# Patient Record
Sex: Male | Born: 1960 | Race: Black or African American | Hispanic: No | Marital: Married | Smoking: Former smoker
Health system: Southern US, Community
[De-identification: ages and names within clinical notes are randomized; demographics above are authoritative.]

## PROBLEM LIST (undated history)

## (undated) DIAGNOSIS — I219 Acute myocardial infarction, unspecified: Secondary | ICD-10-CM

## (undated) DIAGNOSIS — I10 Essential (primary) hypertension: Secondary | ICD-10-CM

## (undated) HISTORY — PX: JOINT REPLACEMENT: SHX530

## (undated) HISTORY — PX: ANGIOPLASTY: SHX39

---

## 1999-07-27 ENCOUNTER — Emergency Department (HOSPITAL_COMMUNITY): Admission: EM | Admit: 1999-07-27 | Discharge: 1999-07-27 | Payer: Self-pay | Admitting: Emergency Medicine

## 2000-03-06 ENCOUNTER — Emergency Department (HOSPITAL_COMMUNITY): Admission: EM | Admit: 2000-03-06 | Discharge: 2000-03-06 | Payer: Self-pay

## 2002-02-18 ENCOUNTER — Emergency Department (HOSPITAL_COMMUNITY): Admission: EM | Admit: 2002-02-18 | Discharge: 2002-02-19 | Payer: Self-pay | Admitting: Emergency Medicine

## 2002-02-19 ENCOUNTER — Encounter: Payer: Self-pay | Admitting: Emergency Medicine

## 2002-12-09 ENCOUNTER — Emergency Department (HOSPITAL_COMMUNITY): Admission: EM | Admit: 2002-12-09 | Discharge: 2002-12-09 | Payer: Self-pay | Admitting: Emergency Medicine

## 2005-01-28 ENCOUNTER — Emergency Department (HOSPITAL_COMMUNITY): Admission: EM | Admit: 2005-01-28 | Discharge: 2005-01-28 | Payer: Self-pay | Admitting: Emergency Medicine

## 2005-01-28 ENCOUNTER — Emergency Department (HOSPITAL_COMMUNITY): Admission: EM | Admit: 2005-01-28 | Discharge: 2005-01-29 | Payer: Self-pay | Admitting: Emergency Medicine

## 2005-10-06 ENCOUNTER — Emergency Department (HOSPITAL_COMMUNITY): Admission: EM | Admit: 2005-10-06 | Discharge: 2005-10-06 | Payer: Self-pay | Admitting: Emergency Medicine

## 2005-10-14 ENCOUNTER — Ambulatory Visit: Payer: Self-pay | Admitting: Internal Medicine

## 2005-11-16 ENCOUNTER — Encounter: Admission: RE | Admit: 2005-11-16 | Discharge: 2005-11-16 | Payer: Self-pay | Admitting: Occupational Medicine

## 2006-01-04 ENCOUNTER — Encounter: Admission: RE | Admit: 2006-01-04 | Discharge: 2006-01-04 | Payer: Self-pay | Admitting: *Deleted

## 2006-01-07 ENCOUNTER — Emergency Department (HOSPITAL_COMMUNITY): Admission: EM | Admit: 2006-01-07 | Discharge: 2006-01-07 | Payer: Self-pay | Admitting: Emergency Medicine

## 2006-02-17 ENCOUNTER — Ambulatory Visit: Payer: Self-pay | Admitting: Family Medicine

## 2006-02-18 ENCOUNTER — Encounter: Admission: RE | Admit: 2006-02-18 | Discharge: 2006-02-18 | Payer: Self-pay | Admitting: Family Medicine

## 2006-06-15 ENCOUNTER — Emergency Department (HOSPITAL_COMMUNITY): Admission: EM | Admit: 2006-06-15 | Discharge: 2006-06-15 | Payer: Self-pay | Admitting: Emergency Medicine

## 2006-06-16 ENCOUNTER — Encounter: Payer: Self-pay | Admitting: Vascular Surgery

## 2006-06-16 ENCOUNTER — Emergency Department (HOSPITAL_COMMUNITY): Admission: EM | Admit: 2006-06-16 | Discharge: 2006-06-16 | Payer: Self-pay | Admitting: Emergency Medicine

## 2006-06-20 ENCOUNTER — Ambulatory Visit: Payer: Self-pay | Admitting: Internal Medicine

## 2006-06-21 ENCOUNTER — Inpatient Hospital Stay (HOSPITAL_COMMUNITY): Admission: EM | Admit: 2006-06-21 | Discharge: 2006-06-25 | Payer: Self-pay | Admitting: Emergency Medicine

## 2006-06-22 ENCOUNTER — Ambulatory Visit: Payer: Self-pay | Admitting: Internal Medicine

## 2006-06-26 ENCOUNTER — Emergency Department (HOSPITAL_COMMUNITY): Admission: EM | Admit: 2006-06-26 | Discharge: 2006-06-26 | Payer: Self-pay | Admitting: Emergency Medicine

## 2006-06-27 ENCOUNTER — Ambulatory Visit: Payer: Self-pay | Admitting: Internal Medicine

## 2006-07-01 ENCOUNTER — Ambulatory Visit: Payer: Self-pay | Admitting: Internal Medicine

## 2006-07-05 ENCOUNTER — Ambulatory Visit: Payer: Self-pay | Admitting: Internal Medicine

## 2006-07-07 ENCOUNTER — Emergency Department (HOSPITAL_COMMUNITY): Admission: EM | Admit: 2006-07-07 | Discharge: 2006-07-07 | Payer: Self-pay | Admitting: Emergency Medicine

## 2006-07-08 ENCOUNTER — Ambulatory Visit: Payer: Self-pay | Admitting: Internal Medicine

## 2006-07-13 ENCOUNTER — Ambulatory Visit: Payer: Self-pay | Admitting: Internal Medicine

## 2006-07-19 ENCOUNTER — Ambulatory Visit: Payer: Self-pay | Admitting: Internal Medicine

## 2006-08-09 ENCOUNTER — Ambulatory Visit: Payer: Self-pay | Admitting: Internal Medicine

## 2006-09-18 ENCOUNTER — Emergency Department (HOSPITAL_COMMUNITY): Admission: EM | Admit: 2006-09-18 | Discharge: 2006-09-18 | Payer: Self-pay | Admitting: Emergency Medicine

## 2006-10-02 DIAGNOSIS — K219 Gastro-esophageal reflux disease without esophagitis: Secondary | ICD-10-CM | POA: Insufficient documentation

## 2006-10-31 ENCOUNTER — Ambulatory Visit: Payer: Self-pay | Admitting: Internal Medicine

## 2006-10-31 LAB — CONVERTED CEMR LAB
Basophils Relative: 0.5 % (ref 0.0–1.0)
Eosinophils Relative: 4.5 % (ref 0.0–5.0)
Lymphocytes Relative: 49.4 % — ABNORMAL HIGH (ref 12.0–46.0)
MCHC: 33 g/dL (ref 30.0–36.0)
MCV: 85.2 fL (ref 78.0–100.0)
Monocytes Relative: 11.8 % — ABNORMAL HIGH (ref 3.0–11.0)
Neutro Abs: 1.7 10*3/uL (ref 1.4–7.7)
RBC: 5 M/uL (ref 4.22–5.81)
RDW: 13.2 % (ref 11.5–14.6)

## 2006-11-23 ENCOUNTER — Ambulatory Visit: Payer: Self-pay | Admitting: Internal Medicine

## 2006-11-24 ENCOUNTER — Encounter: Admission: RE | Admit: 2006-11-24 | Discharge: 2006-11-24 | Payer: Self-pay | Admitting: Occupational Medicine

## 2006-12-05 ENCOUNTER — Ambulatory Visit: Payer: Self-pay | Admitting: Internal Medicine

## 2006-12-23 ENCOUNTER — Ambulatory Visit: Payer: Self-pay | Admitting: Internal Medicine

## 2007-01-06 ENCOUNTER — Ambulatory Visit: Payer: Self-pay | Admitting: Internal Medicine

## 2007-01-08 ENCOUNTER — Emergency Department (HOSPITAL_COMMUNITY): Admission: EM | Admit: 2007-01-08 | Discharge: 2007-01-08 | Payer: Self-pay | Admitting: Emergency Medicine

## 2007-01-09 ENCOUNTER — Encounter: Admission: RE | Admit: 2007-01-09 | Discharge: 2007-01-09 | Payer: Self-pay | Admitting: Occupational Medicine

## 2007-01-13 ENCOUNTER — Ambulatory Visit: Payer: Self-pay | Admitting: Internal Medicine

## 2007-04-15 ENCOUNTER — Inpatient Hospital Stay (HOSPITAL_COMMUNITY): Admission: EM | Admit: 2007-04-15 | Discharge: 2007-04-19 | Payer: Self-pay | Admitting: Emergency Medicine

## 2007-06-29 ENCOUNTER — Ambulatory Visit (HOSPITAL_COMMUNITY): Admission: RE | Admit: 2007-06-29 | Discharge: 2007-06-30 | Payer: Self-pay | Admitting: Cardiology

## 2007-11-14 ENCOUNTER — Emergency Department (HOSPITAL_COMMUNITY): Admission: EM | Admit: 2007-11-14 | Discharge: 2007-11-14 | Payer: Self-pay | Admitting: Emergency Medicine

## 2007-11-27 ENCOUNTER — Emergency Department (HOSPITAL_COMMUNITY): Admission: EM | Admit: 2007-11-27 | Discharge: 2007-11-27 | Payer: Self-pay | Admitting: Emergency Medicine

## 2008-03-06 ENCOUNTER — Ambulatory Visit (HOSPITAL_COMMUNITY): Admission: RE | Admit: 2008-03-06 | Discharge: 2008-03-06 | Payer: Self-pay | Admitting: Cardiology

## 2008-04-30 ENCOUNTER — Ambulatory Visit (HOSPITAL_COMMUNITY): Admission: RE | Admit: 2008-04-30 | Discharge: 2008-04-30 | Payer: Self-pay | Admitting: Cardiology

## 2009-03-12 IMAGING — CR DG CHEST 1V PORT
1 series · 1 of 1 positions shown · non-contrast
Comparison: 11/16/05 and 09/18/06.

CLINICAL DATA: Chest pain.
PORTABLE CHEST ? 1 VIEW:

[view not recorded]
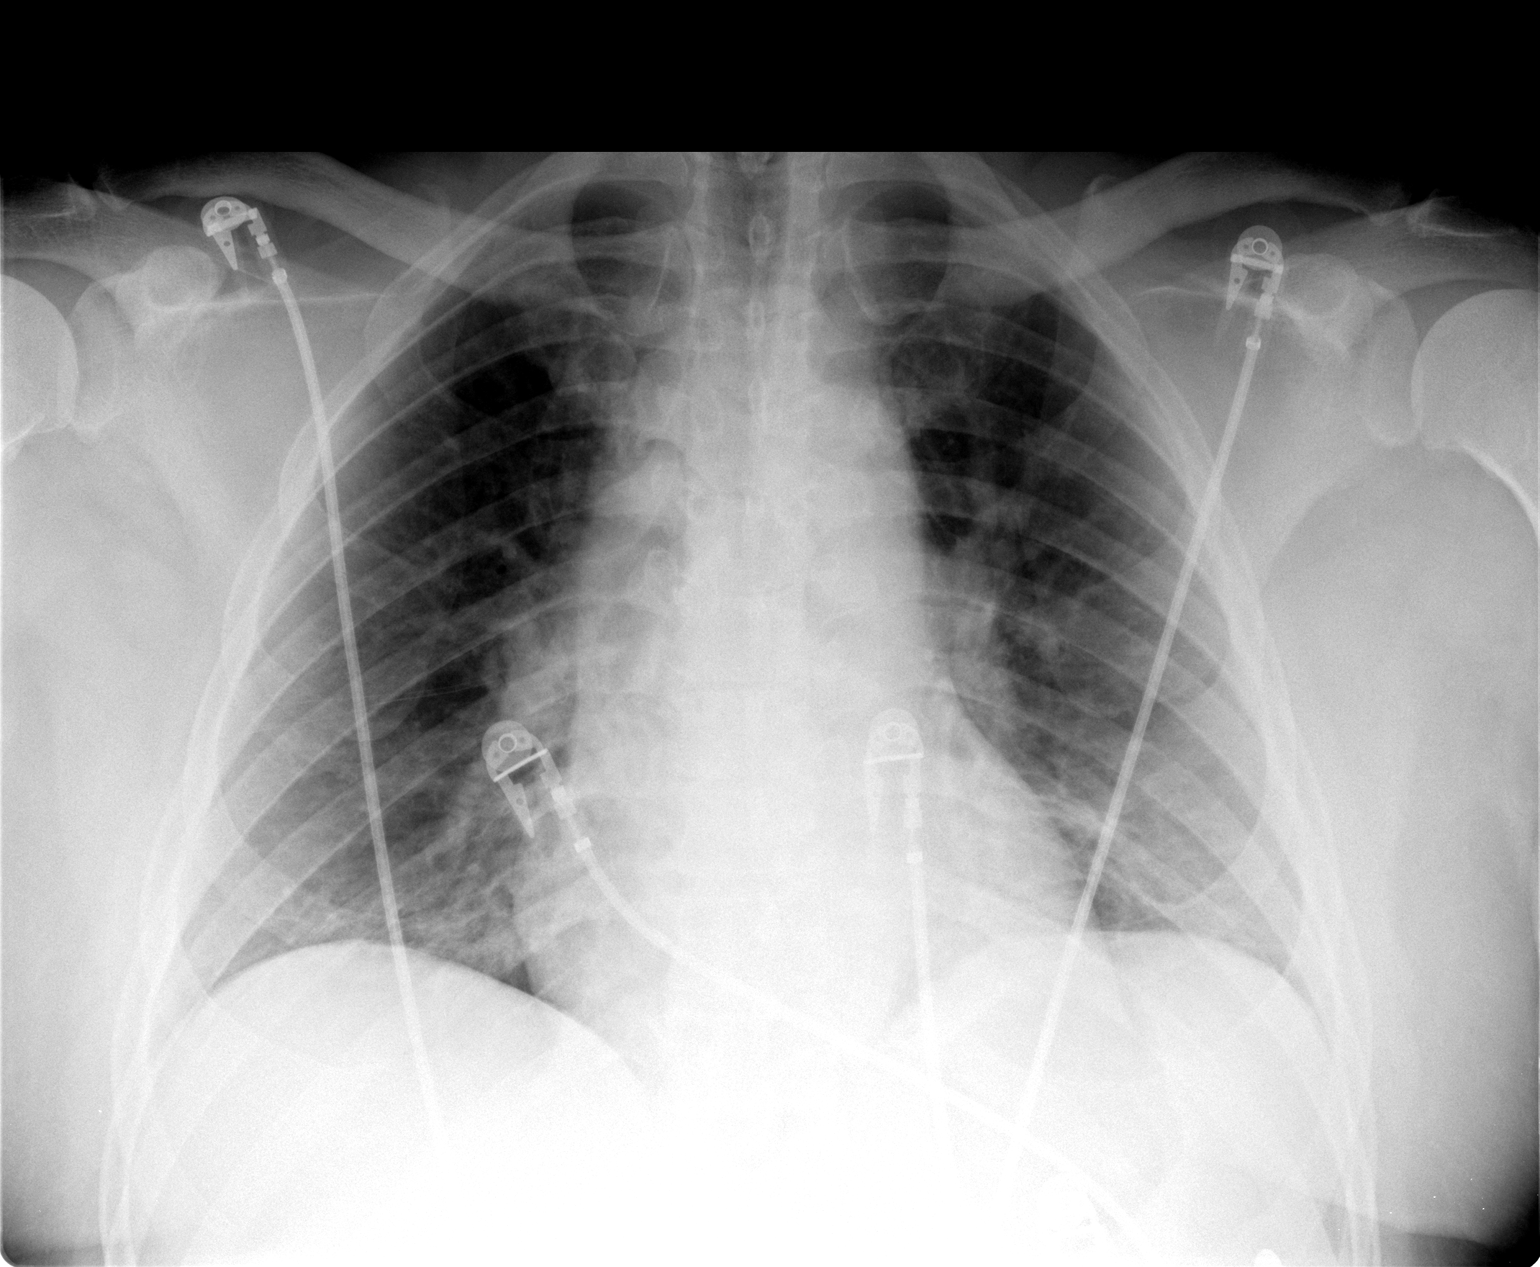

[1 of 1 positions shown; findings below may reference images not displayed]

FINDINGS: Low lung volumes with vascular congestion and bibasilar atelectasis.  Prominent heart size.  No effusion or pneumothorax.
IMPRESSION: Low volume exam with bibasilar atelectasis.

## 2010-10-11 ENCOUNTER — Encounter: Payer: Self-pay | Admitting: Occupational Medicine

## 2010-10-11 ENCOUNTER — Encounter: Payer: Self-pay | Admitting: *Deleted

## 2011-02-02 NOTE — Discharge Summary (Signed)
NAME:  QAADIR, KENT               ACCOUNT NO.:  [id number]   MEDICAL RECORD NO.:  [id number]          PATIENT TYPE:  OIB   LOCATION:  6525                         FACILITY:  [facility]   PHYSICIAN:  Mohan N. Sharyn Lull, M.D. DATE OF BIRTH:  [birth year]   DATE OF ADMISSION:  06/29/2007  DATE OF DISCHARGE:  06/30/2007                               DISCHARGE SUMMARY   ADMITTING DIAGNOSES:  1. New onset angina, rule out restenosis with progression of coronary      artery disease.  2. Coronary artery disease.  3. History of anteroseptal wall myocardial infarction in July 2008.  4. Multivessel coronary artery disease.  5. Hypertension.  6. Borderline diabetes mellitus controlled by diet.  7. Hypercholesteremia.  8. History of alcohol abuse.  9. History of tobacco abuse.  10.Morbid obesity.  11.Positive family history of coronary artery disease.   DISCHARGE DIAGNOSES:  1. Stable angina, status post percutaneous transluminal coronary      angiography stenting to mid right coronary artery.  2. Coronary artery disease.  3. History of anteroseptal wall myocardial infarction in July 2008.  4. Hypertension.  5. Hypercholesteremia.  6. Glucose intolerance.  7. Positive family history of coronary artery disease.  8. Morbid obesity.  9. History of alcohol abuse.  10.History of tobacco abuse.   DISCHARGE MEDICATIONS:  1. Enteric-coated aspirin 325 mg 1 tablet daily.  2. Plavix 75 mg 1 tablet daily with food.  3. Toprol XL 50 mg 1 tablet daily.  4. Lisinopril 5 mg 1 tablet daily.  5. Lipitor 80 mg 1 tablet daily.  6. Nitrostat 0.4 mg sublingual, use as directed.   DIET:  Low salt, low cholesterol, 1800 calorie, ADA diet.  The patient  has been advised to avoid sweets.   SPECIAL INSTRUCTIONS:  Post PTCA and stent instructions have been given.   ACTIVITY:  The patient has been advised to avoid any lifting, driving,  pushing, pulling or sexual activity x1 week.   CONDITION AT DISCHARGE:   Stable.   HISTORY OF PRESENT ILLNESS:  Mr. Horn is a 50 year old, black male  with past medical history significant for anteroseptal wall MI, status  post PCI to LAD, multivessel coronary artery disease, hypertension,  hypercholesteremia with history of DVT in the past.  He came to the  office complaining of retrosternal chest pain off and on associated mild  shortness of breath.  Denies any nausea, vomiting or diaphoresis.  He  denies PND, orthopnea, leg swelling.  Denies palpitation,  lightheadedness or syncope.  The patient had PTCA stenting to LAD in  July 2008, and was noted to have critical stenosis in mid RCA.  The  patient is referred for left catheterization and possible PTCA stenting  to RCA.   PAST MEDICAL HISTORY:  As above.   PAST SURGICAL HISTORY:  Left knee surgery in the past.   ALLERGIES:  NO KNOWN DRUG ALLERGIES.   MEDICATIONS:  Aspirin, Plavix, Toprol and lisinopril.   SOCIAL HISTORY:  He is married on Disability.  He has three children.  Smoked half pack per day x30 years, quit after MI.  He drank hard liquor  for few years, quit 2-3 years ago.   FAMILY HISTORY:  Father died of MI at the age of 59.  Mother is alive  and she is 50.  She is diabetic.  He has three sisters and two brothers  in good health.   PHYSICAL EXAMINATION:  GENERAL:  He is alert and oriented x3 in no acute  distress.  VITAL SIGNS:  Blood pressure was 124/78, pulse was 60 and regular.  HEENT:  Conjunctivae was pink.  NECK:  Supple, no JVD, no bruit.  LUNGS:  Clear to auscultation without rhonchi or rales.  CARDIOVASCULAR:  There was a soft systolic murmur.  ABDOMEN:  Soft.  Bowel sounds were present, nontender.  EXTREMITIES:  There is no clubbing, cyanosis or edema.   LABORATORY DATA AND X-RAY FINDINGS:  Postprocedure hemoglobin is 12.5,  hematocrit 38.4, white count of 6.7.  Blood sugar is high normal at 97.  CPK is 142, MB 1.7, potassium is 4.1, BUN 8, creatinine 1.02.    HOSPITAL COURSE:  The patient was an a.m. admit and underwent a left  cardiac catheterization with selective left and right coronary  angiography and PTCA stenting to mid RCA as per procedure report.  The  patient tolerated procedure well.  There were no complications.  Postprocedure, the patient did not have any chest pains.  His groin is  stable with no evidence of hematoma or bruit.  Phase I cardiac rehab was  called.  The patient has been ambulating in  hallway without any problems.  The patient will be discharged home on  above medications and will be followed up in my office in 1 week and Dr.  Algie Coffer as scheduled.  The patient will be scheduled for phase II  cardiac rehab as outpatient.      Eduardo Osier. Sharyn Lull, M.D.  Electronically Signed     MNH/MEDQ  D:  06/30/2007  T:  07/01/2007  Job:  [id number]   cc:   Ricki Rodriguez, M.D.

## 2011-02-02 NOTE — Cardiovascular Report (Signed)
NAME:  Tim Garcia, Tim Garcia               ACCOUNT NO.:  [id number]   MEDICAL RECORD NO.:  [id number]          PATIENT TYPE:  OIB   LOCATION:  6525                         FACILITY:  [facility]   PHYSICIAN:  Mohan N. Sharyn Lull, M.D. DATE OF BIRTH:  1961-07-14   DATE OF PROCEDURE:  06/29/2007  DATE OF DISCHARGE:                            CARDIAC CATHETERIZATION   PROCEDURE:  1. Left cardiac catheterization with selective left and right coronary      angiography via right groin using Judkins technique.  2. Successful PTCA to mid RCA using 2.5 x 8-mm long Voyager balloon.  3. Successful deployment of 2.75 x 16-mm long Taxus drug-eluting stent      in mid RCA.  4. Successful post dilatation of Taxus drug-eluting stent using 3.0 x      8-mm long Powersail balloon.   INDICATIONS FOR PROCEDURE:  Tim Garcia is a 50 year old black male with  past medical history significant for anteroseptal wall MI in July 2008,  status post PCI to LAD, multivessel CAD, hypertension,  hypercholesteremia, history of DVT and PE in the past.  He was referred  from Dr. Algie Coffer office because of recurrent retrosternal chest pain off  and on associated with mild shortness of breath.  Denies any nausea,  vomiting, diaphoresis.  Denies PND, orthopnea, leg swelling.  Denies  palpitation, lightheadedness or syncope.  The patient had PCI to LAD in  July 2008 and was noted to have critical mid RCA stenosis.  The patient  is referred for PCI to RCA.   PAST MEDICAL HISTORY:  As above.   PAST SURGICAL HISTORY:  He had left knee surgery in the past.   ALLERGIES:  No known drug allergies.   MEDICATION AT HOME:  He is on:  1. Aspirin 81 mg p.o. daily.  2. Plavix 75 mg p.o. daily.  3. Coumadin one tablet daily.  4. Toprol-XL 50 mg one tablet daily.  5. Lisinopril 10 mg a half a tablet daily.   SOCIAL HISTORY:  He is married, on disability, three children.  Smoked a  half a pack per day for 30 years, quit after MI in July 2008.   Drank  hard liquor for few years, quit 2-3 years ago.   FAMILY HISTORY:  Father died of MI at age of 67.  Mother is alive, she  is 67, she is diabetic.  He is two brothers, three sisters in good  health.   EXAMINATION:  He is alert, awake, oriented x3 in no acute distress.  Blood pressure was 124/78, pulse was 60 and regular.  Conjunctivae were  pink.  NECK:  Supple.  No JVD, no bruit.  LUNGS:  Clear to auscultation without rhonchi or rales.  CARDIOVASCULAR:  S1, S2 was normal.  There was a soft systolic murmur.  ABDOMEN:  Soft.  Bowel sounds were present, nontender.  EXTREMITIES:  There was no clubbing, cyanosis or edema.   IMPRESSION:  1. New-onset angina.  2. Coronary artery disease status post anteroseptal wall myocardial      infarction.  3. Multivessel coronary artery disease.  4. Hypertension.  5. Borderline  diabetes mellitus.  6. Hypercholesteremia.  7. 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  8. History of alcohol abuse and tobacco abuse.  9. Morbid obesity.  10.Family history of coronary artery disease.   Discussed with the patient and his wife at length regarding left  catheterization, possible PTCA with stenting; its risks and benefits,  i.e. death, MI, stroke, need for emergency CABG, risk of restenosis,  local vascular complications, etc. and consented for PCI.   PROCEDURE:  After obtaining the informed consent the patient was brought  to the catheterization laboratory and was placed on the fluoroscopy  table.  The right groin was prepped and draped in usual fashion.  Xylocaine 2% was used for local anesthesia in the right groin.  With the  help of thin-wall needle 6-French arterial sheath was placed.  The  sheath was aspirated and flushed.  Next, 6-French left Judkins catheter  was advanced over the wire under fluoroscopic guidance up to the  ascending aorta.  Wire was pulled out, the catheter was aspirated and  connected to the  manifold.  Catheter was further advanced and engaged  into left coronary ostium.  Multiple views of the left system were  taken.  Next, the catheter was disengaged and was pulled out over the  wire and was replaced with 6-French right Judkins catheter, which was  advanced over the wire under fluoroscopic guidance up to the ascending  aorta.  Wire was pulled out, the catheter was aspirated and connected to  the manifold.  Catheter was further advanced and engaged into right  coronary ostium.  Multiple views of the right system were taken.  Next,  __________   FINDINGS:  LV was not done as the patient had LV done approximately 2  months ago.  Left main was patent.  LAD was patent at prior PTCA and  stented site and had 20-25% mid sequential stenosis beyond the stent.  Diagonal one and two were small, which were patent.  Left circumflex has  5-10% proximal and mid sequential stenosis, which is a large vessel.  OM-  1 is moderate size which is patent.  OM-2 and OM-3 are small, which are  patent.  RCA has 20-30% proximal stenosis and then 75% mid stenosis.  PDA and PLV branches are small, which are patent.   INTERVENTIONAL PROCEDURE:  Successful PTCA to mid RCA was done using 2.5  x 8-mm long Voyager balloon for predilatation, and then 2.75 x 16-mm  long Taxus drug-eluting stent was deployed at 18 atmospheres pressure in  the mid RCA.  Stent was postdilated using 3.0 x 8-mm long Powersail  balloon going up to 20 atmospheres pressure.  Lesion was dilated from  75% to 0% residual with excellent TIMI grade 3 distal flow without  evidence of dissection or distal embolization.  The patient received  weight-based Angiomax and 300 mg of Plavix during the procedure.  The  patient tolerated the procedure well and there were no complications.  The patient was transferred to recovery room in stable condition.      Tim Garcia. Sharyn Lull, M.D.  Electronically Signed     MNH/MEDQ  D:  06/29/2007  T:   06/29/2007  Job:  [id number]   cc:   Cath Lab  Ricki Rodriguez, M.D.

## 2011-02-02 NOTE — Op Note (Signed)
NAME:  Tim Garcia, Tim Garcia NO.:  [id number]   MEDICAL RECORD NO.:  [id number]          PATIENT TYPE:  OIB   LOCATION:  2899                         FACILITY:  [facility]   PHYSICIAN:  Eduardo Osier. Sharyn Lull, M.D. DATE OF BIRTH:  Apr 05, 1961   DATE OF PROCEDURE:  04/30/2008  DATE OF DISCHARGE:  04/30/2008                               OPERATIVE REPORT   PROCEDURE:  Left cardiac catheterization with selective left and right  coronary angiography, left ventriculography via right groin using  Judkins technique.   INDICATION FOR THE PROCEDURE:  Mr. Gittleman is a 50 year old black male  with past medical history significant for coronary artery disease,  status post anteroseptal wall MI, multivessel coronary artery disease,  status post PTCA stenting to LAD and RCA in the past, hypertension,  hypercholesterolemia, history of DVT in the past, glucose intolerance,  morbid obesity, history of alcohol abuse and tobacco abuse, complaints  of retrosternal chest pain, off and on lasting few minutes, which  relieves with sublingual nitroglycerin.  Denies any nausea, vomiting, or  diaphoresis.  Denies palpitation, lightheadedness, or syncope.  Denies  PND, orthopnea, or leg swelling.  The patient underwent Persantine  Myoview on March 06, 2008, which showed apical and distal septal scar  with small focus of peri-infarct ischemia with EF of 42%.   PAST MEDICAL HISTORY:  As above.   PAST SURGICAL HISTORY:  He had left knee surgery in the past.   ALLERGIES:  He has intolerance to ACE inhibitors, he gets cough.   MEDICATIONS AT HOME:  1. Aspirin 325 mg everyday.  2. Plavix 75 mg p.o. daily.  3. Toprol 50 mg p.o. daily.  4. Lisinopril 5 mg daily.  5. Lipitor 80 mg p.o. daily.  6. Nitrostat 0.4 mg sublingual p.r.n.   SOCIAL HISTORY:  He is married, on disability.  He has 3 children.  Smoked 1/2 pack per day for 30 plus years.  He used to drink hard  liquor, quit few years ago.   FAMILY  HISTORY:  Father died of MI at the age of 67.  Mother is alive,  she is diabetic.  He has 3 sisters and 2 brothers in good health.   PHYSICAL EXAMINATION:  GENERAL:  He is alert, awake, and oriented x3 in  no acute distress.  VITAL SIGNS:  Blood pressure is 110/70 and pulse is 68 and regular.  HEENT:  Conjunctivae was pink.  NECK:  Supple.  No JVD.  No bruit.  LUNGS:  Clear to auscultation without rhonchi or rales.  CARDIOVASCULAR:  S1 and S2 was normal.  There was a soft systolic  murmur.  There was no S3 or gallop.  ABDOMEN:  Soft.  Bowel sounds were present, nontender.  EXTREMITIES:  There is no clubbing, cyanosis, or edema.   IMPRESSION:  New-onset angina, positive Persantine Myoview, rule out  restenosis versus progression of diffuse hypertension,  hypercholesterolemia, glucose intolerance, morbid obesity, CAD, history  of MI in the past, positive family history of coronary artery disease,  history of alcohol abuse and tobacco abuse.  Discussed with the patient  at length  regarding the Persantine Myoview results and various options  of treatment, i.e. medical versus left cath, possible PTCA stenting, its  risks and benefits i.e. death, MI, stroke, need for emergency CABG, risk  of restenosis, local vascular complications, etc., and consented for the  procedure.   PROCEDURE:  After obtaining the informed consent, the patient was  brought to the cath lab and was placed on fluoroscopy table.  The right  groin was prepped and draped in usual fashion.  A 2% Xylocaine was used  for local anesthesia in the right groin.  With the help of thin-walled  needle, 6-French arterial sheath was placed.  The sheath was aspirated  and flushed.  Next, 6-French left Judkins catheter was advanced over the  wire under fluoroscopic guidance up to the ascending aorta.  Wire was  pulled out, the catheter was aspirated and connected to the manifold.  Catheter was further advanced and engaged into left  coronary ostium.  Multiple views of the left system were taken.  Next, the catheter was  disengaged and was pulled out over the wire and was replaced with 6-  [place] right Judkins catheter, which was advanced over the wire under  fluoroscopic guidance up to the ascending aorta.  Wire was pulled out,  the catheter was aspirated and connected to the manifold.  Catheter was  further advanced and engaged into right coronary ostium.  Multiple views  of the right system were taken.  Next, the catheter was disengaged and  was pulled out over the wire and was replaced with 6-French pigtail  catheter, which was advanced over the wire under fluoroscopic guidance  up to the ascending aorta.  Wire was pulled out, the catheter was  aspirated and connected to the manifold.  Catheter was further advanced  across the aortic valve into the LV.  LV pressures were recorded.  Next,  LV graft was done in 30-degree RAO position.  Post angiographic  pressures were recorded from LV and then pullback pressures were  recorded from the aorta.  There was no gradient across the aortic valve.  Next, the pigtail catheter was pulled out over the wire.  Sheaths were  aspirated and flushed.   FINDINGS:  LV showed moderate global hypokinesia and severe anteroapical  wall hypokinesia, EF of 40% approximately.  Left main was patent.  LAD  has 20-25% proximal stenosis just prior to the stent.  Stented segment  is widely patent.  Diagonal I has 80% ostial stenosis as well as very  very small, not suitable for PCI.  Diagonal II is very very small.  Left  circumflex has 10-15% proximal and mid stenosis.  OM1 was very small.  OM2 was moderate-sized, which was patent.  OM3 to OM5 were small, which  were patent.  RCA has 20-30% proximal stenosis and mid stented segment  is patent distally as well as  small PDA and PLV branches are small.  The patient tolerated the procedure well.  There were no complications.  The patient was  transferred to recovery room in stable condition.      Eduardo Osier. Sharyn Lull, M.D.  Electronically Signed     MNH/MEDQ  D:  04/30/2008  T:  05/01/2008  Job:  [id number]

## 2011-02-02 NOTE — Cardiovascular Report (Signed)
NAME:  Tim Garcia, Tim Garcia NO.:  [id number]   MEDICAL RECORD NO.:  [id number]          PATIENT TYPE:  INP   LOCATION:  2909                         FACILITY:  [facility]   PHYSICIAN:  Eduardo Osier. Sharyn Lull, M.D. DATE OF BIRTH:  12-14-1960   DATE OF PROCEDURE:  04/16/2007  DATE OF DISCHARGE:                            CARDIAC CATHETERIZATION   PROCEDURE:  1. Left cardiac catheterization with selective left and right coronary      angiography, left ventricle via right groin using Judkins      technique.  2. Successful percutaneous transluminal coronary angioplasty to      proximal left anterior descending using 3 x 15-mm-long Voyager      balloon.  3. Successful deployment of 3.5 x 33-mm-long Cypher drug-eluting stent      in proximal left anterior descending.  4. Successful post dilatation of Cypher drug-eluting stent using 3.75      x 18-mm-long PowerSail balloon.   INDICATIONS FOR PROCEDURE:  Briefly, Mr. Cancio is a 50 year old black  male with past medical history significant for DVT, history of pulmonary  embolism in the past, tobacco abuse, who was admitted because of  recurrent retrosternal chest pain off and on since last night.  More  than 24 hours ago, the patient started acid reflux, but did not seek  medical attention initially and then came to the ER early this morning  via EMS, as chest pain got worse, associated with mild shortness of  breath, diaphoresis and left arm numbness.  The patient received IV  nitrates, heparin, aspirin, Plavix and Integrilin with relief of chest  pain.  EKG done in the ER initially showed normal sinus rhythm.  Repeat  EKG done this evening during chest pain showed normal sinus rhythm with  Q waves in anteroseptal leads with ST elevation and evolving ST-T wave  changes in the anteroseptal leads suggestive of anteroseptal wall MI and  was noted to have elevated CPK-MB and troponin I.  I discussed with the  patient regarding emergency  left cath, possible PTCA and stenting, its  risks and benefits, i.e. death, MI, stroke, need for emergency CABG,  risk of restenosis, local vascular complications, etc, and consented for  the procedure.   PROCEDURE:  After obtaining the informed consent, the patient was  brought to the cath lab and was placed on fluoroscopy table.  The right  groin was prepped and draped in the usual fashion.  Xylocaine 2% was  used for local anesthesia in the right groin with the help of a thin-  walled needle, a 6-French arterial sheath was placed.  Sheath was  aspirated and flushed.  Next, a 6-French left Judkins catheter was  advanced over the wire under fluoroscopic guidance up to the ascending  aorta.  Wire was pulled out.  The catheter was aspirated and connected  to the manifold.  Catheter was further advanced and engaged into left  coronary ostium.  Multiple views of the left system were taken.  Next,  the catheter was disengaged and was pulled out over the wire and was  replaced with a 6-French  right Judkins catheter, which was advanced over  the wire under fluoroscopic guidance up to the ascending aorta.  Wire  was pulled out.  The catheter was aspirated and connected to the  manifold.  Catheter was further advanced and engaged into right coronary  ostium.  Multiple views of the left system were taken.  Next, the  catheter was disengaged and multiple views of the right system were  taken.  Catheter was disengaged and was pulled out over the wire and was  replaced with a 6-French pigtail catheter at the end of the procedure,  which was advanced over the wire under fluoroscopic guidance into the  ascending aorta.  Wire was pulled out.  The catheter was aspirated and  connected to the manifold.  Catheter was further advanced across the  aortic valve into the LV.  LV pressures were recorded.  Next, left  ventriculography was done in 30-degree RAO position.  Post angiographic  pressures were  recorded from LV and then pullback pressures were  recorded from the aorta.  There was no gradient across the aortic valve.  Next, the catheter was pulled out over the wire.  Sheaths were aspirated  and flushed.   FINDINGS:  LV showed severe anterolateral and apical hypokinesia, EF of  40% to 45%.  Left main was patent.  LAD had proximal 90% to 95% stenosis  with ruptured plaque and thrombus, with TIMI-2 flow.  Diagonal #1 has  20% to 25% ostial stenosis.  Diagonal #2 was very small, which was  patent.  [place] was very, very small.  Left circumflex was large, which  has 10% to 15% mid stenosis.  OM-1 was moderate size, which has 15% to  20% mid stenosis.  OM-2 was very small.  OM-3 and -4 were small, which  were patent.  RCA has diffuse 30% to 40% proximal stenosis and 85% to  90% mid focal stenosis.  PDA and PLV branches were small, which were  patent.   INTERVENTIONAL PROCEDURE:  Successful PTCA to proximal LAD was done  using 3 x 15-mm-long Voyager balloon for predilatation and then 3.5 x 33-  mm-long Cypher drug-eluting stent was deployed in proximal LAD, going up  to 18 atmospheric pressure.  Stent was postdilated using 3.75 x 18-mm-  long PowerSail balloon going up to 18 atmospheric pressure, lesion  dilated from 90% to 95% with rupture of plaque to 0% residual with  excellent TIMI grade 3 distal flow without evidence of dissection or  distal embolization.  The patient received weight-based heparin,  Integrilin and 600 mg of Plavix during the procedure.  The patient  tolerated the procedure well.  There were no complications.  The patient  was transferred to recovery room in stable condition.      Eduardo Osier. Sharyn Lull, M.D.  Electronically Signed     MNH/MEDQ  D:  04/16/2007  T:  04/17/2007  Job:  [id number]   cc:   [facility]  Ricki Rodriguez, M.D.

## 2011-02-02 NOTE — Assessment & Plan Note (Signed)
[facility]                                 ON-CALL NOTE   DAELYN, MOZER                        MRN:          [id number]  DATE:02/19/2007                            DOB:          [birth year]    TIME OF CALL:  4:48pm.   CALLER:  Patient.   PRIMARY CARE PHYSICIAN:  Dr. Drue Novel.   TELEPHONE NUMBER:  [phone]   The patient states that he ran out of Coumadin last night and is asking  me to call some in. He feels fine. My answer is that no we do not call  in routine medication refills on the weekends, and he should contact Dr.  Drue Novel tomorrow.     Tera Mater. Clent Ridges, MD  Electronically Signed    SAF/MedQ  DD: 02/19/2007  DT: 02/20/2007  Job #: [id number]

## 2011-02-05 NOTE — Assessment & Plan Note (Signed)
[facility]ALTHC[facility]                                 ON-CALL NOTE   KAICEN, DESENA                        MRN:          [id number]  DATE:11/23/2005                            DOB:          1961-01-19    PHONE NUMBER:  [phone]   PATIENT'S PHYSICIAN:  Willow Ora, M.D.   The patient called for refill of Coumadin.  I called back and got an  answering machine.  He called back later.  I returned the call the  second time.  He stated he ran out of his Coumadin.  He had not had any  doses in 4 days.  He stated that his wife was supposed to get his  Coumadin but she forgot on Monday and forgot today.  He stated that he  had a DVT with a pulmonary embolus since September 2007.  He was  supposed to be taking his Coumadin on a regular basis.  I explained to  him how important it was that he not miss a dose and after 3-4 days  being off he was not anticoagulated at all.  The patient stated, Well,  I'm a busy man.  I don't have time to go get my medicines.  I explained  to him that he would not be a busy man if he had another PE and dropped  dead.  I tried to emphasize the importance of his underlying problem.  I  explained to him again the fatal complications of a PE; even though we  are 5-6 months out this could be a significant problem.  Therefore, I  told him we would call him in Coumadin 5 mg to take two STAT, and then  to call Dr. Leta Jungling office and be seen in the office on Friday for  evaluation.     Jeffrey A. Tawanna Cooler, MD  Electronically Signed    JAT/MedQ  DD: 11/25/2006  DT: 11/25/2006  Job #: [id number]

## 2011-02-05 NOTE — Discharge Summary (Signed)
NAME:  Tim Garcia, Tim Garcia               ACCOUNT NO.:  [id number]   MEDICAL RECORD NO.:  [id number]          PATIENT TYPE:  INP   LOCATION:  1420                         FACILITY:  [facility]   PHYSICIAN:  Valerie A. Felicity Coyer, MDDATE OF BIRTH:  Jan 10, 1961   DATE OF ADMISSION:  06/21/2006  DATE OF DISCHARGE:  06/25/2006                                 DISCHARGE SUMMARY   DISCHARGE DIAGNOSES:  1. Acute bilateral pulmonary embolisms.  2. Recent left lower extremity deep vein thrombosis diagnosis falling once      daily.  Home Lovenox with subtherapeutic INR.  3. Tobacco abuse with commitment for cessation.  4. History of remote left lower extremity deep vein thrombosis in      association with knee surgery 1984.  5. Obesity with weight of 256 pounds.   DISCHARGE MEDICATIONS:  1. Coumadin 10 mg p.o. daily or as instructed by primary MD see details      below.  2. Lovenox 120 mg subcu b.i.d. (6 p.m. and 6 a.m. x2 additional doses to      ensure 48-hour overlap with therapeutic INR).  This will also the fifth      day of the patient's consecutive days of Lovenox therapy since      diagnosis of PE.  3. Vicodin 5/500 one to two p.o. q.4-6h. p.r.n. pain.   DISPOSITION:  The patient is discharged home in medically stable and  improved condition, hemodynamically stable with normal O2 sats 98% on room  air.  Hospital follow-up is scheduled immediately on Monday, June 27, 2006  at 11:30 a.m. with primary care physician, Dr. Willow Ora, for a post hospital  follow-up as well as INR checked there in the office.  Should INR be less  than 2.0, we will need to resume full-dose q.12h. Lovenox with close  monitoring.  Likewise routine management of Coumadin to keep INR between two  and three in 12 months perhaps lifelong to be determined by primary MD.   HOSPITAL COURSE:  Problem 1:  ACUTE BILATERAL PE:  The patient is a 50-year-  old gentleman with remote history of DVT who was recently diagnosed with  left lower extremity DVT three days prior and was being treated on an  outpatient basis with once daily, Lovenox and outpatient Coumadin titration  who was sitting watching TV on the evening of admission when he suddenly  felt a hot flash over his body followed by a right-sided chest pain and  shortness of breath.  He came to the emergency room around midnight and  underwent a CT angio which did confirm acute bilateral PEs and was referred  for further anticoagulation and Coumadin titration.  He was treated with  q.12h. Lovenox according to pharmacy protocol and increased dose of Coumadin  as his INR was only 1.6 at time of admission.  Pharmacy treated the patient  with 10 mg Coumadin daily with a slow but steady rise in his INR level.  When he reached 2.0., because of concerns for over titration, he was dropped  to 7.5 mg daily which caused a decline in his INR  to 1.9.  Thus his Coumadin  level was again resumed at 10 mg daily and the patient's INR on day of  discharge is 2.1.  The patient has two remaining doses of Lovenox injections  at home from his previous prescription and has been instructed to continue  these tonight and in the morning to ensure an adequate overlap with  therapeutic INR.  Likewise completion of dosing today will be his fifth  consecutive day of Lovenox since diagnosis of PE which is appropriate  according to guidelines.  He has remained hemodynamically stable.  His chest  pain has been resolved.  His primary complaint of pain during his  hospitalization has been related to the left leg with discomfort from the  previous diagnosis of DVT.  In any event, at this time, he had no  significant pain related to this.  He is given a minimal supply of Vicodin  for persisting discomfort that may occur once he is home with close  outpatient follow-up arranged approximately 36 hours from now with his  primary care physician on Monday morning to ensure adequate close monitoring   of his anticoagulation.  At this time, primary care physician and myself are  leaning toward lifelong Coumadin given his multiple events.  The patient is  reluctant to stay on Coumadin forever.  At this time, I have instructed  him that a minimum 12 months will be necessary given his diagnosis of PE and  further anticoagulation management will be determined on an ongoing basis as  he completes his treatment.  There have been no other medical issues or  complications during this hospitalization and he is felt stable for  discharge home.      Valerie A. Felicity Coyer, MD  Electronically Signed     VAL/MEDQ  D:  06/25/2006  T:  06/27/2006  Job:  [id number]   cc:   Willow Ora, MD  [phone] [place], [place] [address]

## 2011-02-05 NOTE — Assessment & Plan Note (Signed)
[facility]ALTHC[facility]                                   ON-CALL NOTE   Tim Garcia, Tim Garcia                        MRN:          [id number]  DATE:06/26/2006                            DOB:          07-02-1961    Primary care physician is Dr. Drue Novel.  [name] is a patient who was  recently discharged from the hospital yesterday for a DVT and a pulmonary  embolism.  According to the wife, while he was in the hospital, he was  complaining of lower abdominal pain.  It was not severe and he was  prescribed pain medication.  According to the wife, his pain has gotten  worse since he has been home.  She states that it is intolerable.  He denies  any other symptoms.  He is currently on Lovenox and Coumadin.   PLAN:  Given the worsening of his discomfort I advised that he needs to be  reevaluated again in the emergency department.  Wife agreed and she will  take him there immediately to [facility].            ______________________________  Leanne Chang, M.D.      LA/MedQ  DD:  06/26/2006  DT:  06/27/2006  Job #:  [id number]

## 2011-02-05 NOTE — Assessment & Plan Note (Signed)
[facility]ALTHCARE                                 ON-CALL NOTE   TRITON, HEIDRICH                        MRN:          [id number]  DATE:11/27/2006                            DOB:          [birth year]    TIME OF CALL:  8:03pm   TELEPHONE NUMBER:  [phone]   He hurt his hand this weekend and said he went to an urgent care. They  told him that he could take 3 Motrin every 6 hours for pain and  inflammation, but the patient is on Coumadin and wondered if he could  still take that. I recommended that it would be better for him to take  Tylenol arthritis, use ice, and follow up with Dr. Drue Novel this week. The  patient states that his coumadin level was fine last week, but I did  tell him that if he was on Motrin it might be need to be checked a  little more carefully, so the patient will not take ibuprofen tonight  and call Dr. Drue Novel in the morning for further instructions.     Lelon Perla, DO  Electronically Signed    Shawnie Dapper  DD: 11/27/2006  DT: 11/28/2006  Job #: [id number]   cc:   Willow Ora, MD

## 2011-02-05 NOTE — Discharge Summary (Signed)
NAME:  Tim Garcia, Tim Garcia NO.:  [id number]   MEDICAL RECORD NO.:  [id number]          PATIENT TYPE:  INP   LOCATION:  2006                         FACILITY:  [facility]   PHYSICIAN:  Ricki Rodriguez, M.D.  DATE OF BIRTH:  May 14, 1961   DATE OF ADMISSION:  04/15/2007  DATE OF DISCHARGE:  04/19/2007                               DISCHARGE SUMMARY   FINAL DIAGNOSES:  1. Acute myocardial infarction, anterior wall, initial episode.  2. Coronary atherosclerosis of native coronary vessel.  3. Esophageal reflux.  4. History of venous thrombosis and embolism.  5. Obesity.   PRINCIPAL PROCEDURE:  Percutaneous transluminal coronary angioplasty  using drug-eluting stent by Dr. Rinaldo Cloud on April 16, 2007.   DISCHARGE MEDICATIONS:  1. Aspirin 325 mg one daily.  2. Zocor 40 mg daily.  3. Lisinopril 5 mg daily.  4. Lopressor 50 mg half twice a day.  5. Plavix 75 mg one daily.   FOLLOWUP:  Followup by the [facility] and Dr. Algie Coffer  in 2 weeks.   DISCHARGE ACTIVITY:  The patient to increase activity slowly.   SPECIAL INSTRUCTIONS:  The patient to stop any activity that causes  chest pain, shortness of breath, dizziness, sweating and kind of further  weakness.   WOUND CARE INSTRUCTIONS:  The patient to notify if right groin pain,  swelling or discharge.   DISCHARGE DIET:  Low-sodium, heart-healthy diet.   CONDITION ON DISCHARGE:  Stable.   HISTORY:  This 50 year old black male presented with chest pain, burning  type, with sweating spell after moving heavy boxes for few days.   PAST MEDICAL HISTORY:  Positive for smoking, DVT and obesity.   PHYSICAL EXAMINATION:  Temperature 98, pulse 64, respirations 17, blood  pressure 137/91, height 6 feet 5 inches, weight 260 pounds.  The patient  is a well-developed, well-nourished black male in no significant  distress.  HEENT:  Normocephalic, atraumatic with brown eyes.  Pupils  are equal and round, reactive to  light.  Neck:  No JVD.  LUNGS:  Clear  bilaterally.  CHEST WALL:  Minimal tenderness.  HEART:  Normal S1-S2.  ABDOMEN:  Soft and nontender.  EXTREMITIES:  No  edema, cyanosis or clubbing.  SKIN:  Warm and dry.  NEUROLOGICAL:  Cranial nerves grossly intact.  The patient moves all 4 extremities.   LABORATORY DATA:  Hemoglobin 15, hematocrit 44, normal WBC count.  Creatinine 1.  INR 1.   EKG showed ST elevation in anteroseptal leads.   Cardiac catheterization showed 90% to 95% stenosis with a rupture plaque  and thrombosis in the left anterior descending coronary artery and right  coronary artery with a mid 85% to 90% stenosis.  Post PTCA and 3.5 x 33-  mm drug-eluting stent placement, TIMI grade 3 flow was established in  left anterior descending coronary artery.   HOSPITAL COURSE:  The patient was admitted to the telemetry unit.  He  ruled in for acute anterior wall myocardial infarction.  Emergent  cardiac catheterization was done; a drug-eluting 3.5 by 33-mm Cypher  stent was placed in the left anterior  descending coronary artery by Dr.  Rinaldo Cloud with an excellent result.  He had additional blockage in  right coronary artery of 85% to 90% severity, which would be dealt with  subsequent weeks, post stabilization of his current MI.  He was  transferred to the coronary care units.  His condition improved in the  next 72 hours of hospitalization and he was discharged home in  satisfactory condition with followup by me in 2 weeks.      Ricki Rodriguez, M.D.  Electronically Signed     ASK/MEDQ  D:  07/06/2007  T:  07/07/2007  Job:  [id number]

## 2011-02-05 NOTE — Assessment & Plan Note (Signed)
[facility]                                 ON-CALL NOTE   Tim Garcia, Tim Garcia                        MRN:          [id number]  DATE:08/26/2006                            DOB:          1960-10-25    PRIMARY CARE PHYSICIAN:  Willow Ora, M.D.   DESCRIPTION OF CALL:  Mr. Genet is a 50 year old male with a history  of recurrent pulmonary emboli.  He was admitted in October 2007 with a  diagnosis of bilateral PE, at that time he was placed on Lovenox and  Coumadin.  He called tonight to say that he has episodic periods of  shortness of breath, these are not severe, and he was actually quite  comfortable on the phone.  He denied any significant chest pain.  In  talking with him, he said that he stopped his Coumadin a while back as  he ran out of it and he felt like he did not need it anymore.  He did  not discuss this with his physician.  I told him that if he is  experiencing severe shortness of breath he needs to come to the  emergency room right away.  However, if he is comfortable then he can  wait until Monday morning and follow up with Dr. Drue Novel immediately to  reinitiate his anticoagulation.     Bevelyn Buckles. Bensimhon, MD  Electronically Signed    DRB/MedQ  DD: 08/26/2006  DT: 08/27/2006  Job #: [id number]

## 2011-02-05 NOTE — Assessment & Plan Note (Signed)
[facility] HEALTHCARE                                 ON-CALL NOTE   REBEL, WILLCUTT                        MRN:          [id number]  DATE:10/25/2006                            DOB:          17-Sep-1961    PRIMARY CARE PHYSICIAN:  Willow Ora, M.D.   Mr. Merry is a patient of Dr. Drue Novel who reports that over the last  several days he has been having dizziness, usually associated with  getting up or walking.  This morning while walking in his kitchen, he  noticed lightheadedness.  He denied any shortness of breath or  palpitations.  Mr. Wierzbicki is on Coumadin daily and was concerned that  this was an adverse effect from Coumadin.   PLAN:  I advised Mr. Deren to be seen this morning in the emergency  department, especially if the symptoms have worsened.  I advised him  that since he is calling early this morning around 2 o'clock, my concern  is that his symptoms are worse than previous and based on the above, I  would advise him to be seen as soon as possible and not wait until the  office opens later on this morning.  The patient was a little hesitant  and states that he will most likely wait until around 8 o'clock to call  the office to schedule an appointment.  I reiterated my concerns and  advised him that if he is going to wait to monitor his symptoms and if  they worsen in any way he is to immediately seek medical attention.     Leanne Chang, M.D.  Electronically Signed    LA/MedQ  DD: 10/25/2006  DT: 10/25/2006  Job #: [id number]

## 2011-02-05 NOTE — Letter (Signed)
December 21, 2006    [facility]  [address]  [place], [place]  [address]   RE:  JAMMY, PLOTKIN  MRN:  [id number]  /  DOB:  May 13, 1961   Dear Mr. Chisolm,   This letter is to inform you that we will not be able to continue to  provide your medical care in this office.  From this point on, you have  30 days to find a new physician to be your primary care doctor.  We will  only see you in case of emergency in the next 30 days.  The reason for  this hard decision is the lack of compliance with your medication and  with your appointments.   As you know, in my mind, it is extremely important to let us check your  Coumadin routinely because, as I have said before, Coumadin can be an  extremely dangerous medicine, if it is not monitored properly.   If you need any help in your transition from this office to a new  primary care doctor, please do not hesitate to call me and we will be  happy to forward your records to your new doctor.    Sincerely,      Willow Ora, MD  Electronically Signed    JP/MedQ  DD: 12/21/2006  DT: 12/21/2006  Job #: [id number]

## 2011-02-05 NOTE — H&P (Signed)
NAME:  Tim Garcia, Tim Garcia               ACCOUNT NO.:  [id number]   MEDICAL RECORD NO.:  [id number]          PATIENT TYPE:  INP   LOCATION:  1420                         FACILITY:  [facility]   PHYSICIAN:  Marrian Salvage. Freida Busman, MD     DATE OF BIRTH:  28-Dec-1960   DATE OF ADMISSION:  06/21/2006  DATE OF DISCHARGE:                                HISTORY & PHYSICAL   PRIMARY MEDICAL DOCTOR:  Willow Ora, MD at [facility].   CONTACT PERSON:  The patient's wife, Rosey Bath at [phone].   CHIEF COMPLAINT:  Chest pain, shortness of breath.   HISTORY OF PRESENT ILLNESS:  The patient is a 50 year old man who has a  history of DVT complicated by PE back in 1999.  The DVT was in his right leg  at that time.  He was diagnosed this last Thursday with DVT in the left leg.  That leg has had surgery from prior injury.  He was discharged from the  emergency department on Lovenox and Coumadin, of which the patient thinks  the Lovenox dosing was once daily.  He was told to follow up with his  primary medical doctor for INR checks which he did today.  His PMD  recommended lifelong Coumadin to patient.  INR is unknown.   The patient was stable until tonight.  While sitting watching TV around 1:00  a.m. he had hot flushing sensations as well as shortness of breath and mild  chest pain.  This was pleuritic.  No syncope or dizziness.  His wife brought  him to the emergency department.   Here in the ED vital signs were stable.  He underwent CT angiography which  showed PE on the right side.  He was reloaded on Lovenox and admitted to the  medical service.   PAST MEDICAL HISTORY:  1. DVT/venous thromboembolism in 1999 and 2007.  2. Chronic back pain.  3. Tobacco abuse.  4. Obesity at 256 pounds.  5. History of left lower extremity injury in the [employer] requiring surgery on      the knee in 1987.   ALLERGIES:  NICOTINE PATCH CAUSED SKIN IRRITATION   CURRENT MEDICATIONS:  1. Lovenox unknown dose once daily.  2. Coumadin 7.5 mg  nightly.  3. Mucinex 600 mg b.i.d.  4. Tylenol #3 as needed.  5. NyQuil.   SOCIAL HISTORY:  The patient lives in [place] with his wife.  He has  three children ages 64, 24 and 83.  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  He formerly worked in [employer].  The patient currently smokes  about one-quarter to one half pack per day.  He said he never smoked more  than one pack per day.  He has been trying to quit for quite some time.  He  does not use significant alcohol.   FAMILY HISTORY:  Mother is alive and well.  Father died of cancer.  He has  two brothers and three sisters all of whom are alive and well.   REVIEW OF SYSTEMS:  Negative x 14 systems except as outlined above.  ADVANCE DIRECTIVE:  Full code.   PHYSICAL EXAMINATION:  Temperature 99, pulse 86, blood pressure 128/76,  respirations 18 and saturation was 98% on 2 liters.  GENERAL:  The patient was in no distress.  No rash.  HEENT: No jaundice.  Oral mucosa without lesions.  JVP was flat.  Thyroid  without enlargement.  No carotid bruits.  LUNGS: Clear to auscultation bilaterally.  There is no rub on the right  side.  HEART: Was regular rate and rhythm with normal S1-S2, no murmur and no  gallop.  ABDOMEN:  Soft, nontender.  Right groin had a 2+ pulse.  EXTREMITIES:  There was no significant edema on either side.  No Homan's or  cord in the right or left leg.   EKG showed rate of 71 and sinus rhythm with left axis, normal intervals, no  Qs.  He had some mild nonspecific ST-T changes.   LABORATORY:  CTA the chest showed right-sided PE.   Sodium 135, potassium 3.9, creatinine 1.2, glucose 107, INR 1.6.   IMPRESSION:  50 year old man who now has recurrent venous thromboembolism  and has now had a clot pass to his lungs.   1. Venous thromboembolism.  DVT to PE.  This was while on therapy but it      sounds like he was only on half-dose Lovenox.  Will continue the      Lovenox at 190 mg per kg b.i.d..   Lateral view strong indication for      second in 10/08 level as he is does was low before.  Will increase      Coumadin 10 mg nightly for:  Half to three.  I would recommend lifelong      anticoagulation given multiple events.  This may rarely obviate the      need for hypercoagulability workup as a positive for negative test      would change our management.  Will also check for signs of a restrained      of vas symptoms and a BNP in a troponin.  We could consider      transthoracic echo to look a RV function are his PAF is blood pressure      decreases ears heart rate increases.  There is no indication for      probable lysis now.  Oxygen as needed.  Bed rest short-term.  2. Prime  3. Tobacco abuse.  Cessation counseling was done and I have had further in      the hospital.  4. Prophylaxis.  Out of bed soon.  On heparin.  He is eating actively.   DISPOSITION:  Hopefully home in a couple of days.           ______________________________  Marrian Salvage Freida Busman, MD     LAA/MEDQ  D:  06/21/2006  T:  06/22/2006  Job:  [id number]

## 2011-02-05 NOTE — Discharge Summary (Signed)
NAME:  Tim Garcia, PEARY NO.:  [id number]   MEDICAL RECORD NO.:  [id number]          PATIENT TYPE:  INP   LOCATION:  2006                         FACILITY:  [facility]   PHYSICIAN:  Ricki Rodriguez, M.D.  DATE OF BIRTH:  Jan 04, 1961   DATE OF ADMISSION:  04/15/2007  DATE OF DISCHARGE:  04/19/2007                               DISCHARGE SUMMARY   FINAL DIAGNOSES:  1. Acute anterior wall myocardial infarction.  2. Coronary atherosclerosis of native coronary vessel.  3. Esophageal reflux disease.  4. Obesity.  5. Personal history of venous thrombosis and embolism.   PRINCIPAL PROCEDURE:  Left heart catheterization, selective coronary  angiography and insertion of vascular stent by Dr. Rinaldo Cloud.   DISCHARGE MEDICATIONS:  1. Aspirin 325 mg 1 daily.  2. Plavix 75 mg 1 daily.  3. Zocor 40 mg 1 in the evening.  4. Lisinopril 5 mg daily.  5. Lopressor 50 mg half twice daily.   DISCHARGE DIET:  Low-sodium, heart-healthy diet.   DISCHARGE ACTIVITY:  The patient to increase activity slowly.   SPECIAL INSTRUCTIONS:  The patient to stop any activity that causes  chest pain, shortness of breath, dizziness, sweating or excessive  weakness.   WOUND CARE INSTRUCTIONS:  The patient to notify right groin pain,  swelling or discharge.   FOLLOW-UP:  By [facility] outpatient at [facility].  The patient to call [phone] with extension [phone] and by Dr. Orpah Cobb in 2 weeks.  The  patient to call [phone] for appointment.   HISTORY:  This 50 year old black male presented with worsening type  chest pain with sweating spell without nausea, shortness of breath and  denying any relief from nitroglycerin use.  The patient said he had been  moving heavy boxes lately.   PHYSICAL EXAMINATION:  Temperature 98, pulse 64, respirations 17, blood  pressure 157/91, height 6 feet 1/2 inch and weight 260 pounds.  GENERAL:  The patient is well-built, well-nourished black male in no  significant distress.  HEENT:  The patient is normocephalic, atraumatic with brown eyes.  Pupils are equal, round, and reactive to light.  Extraocular movement  intact.  NECK:  No JVD.  LUNGS:  Clear.  CHEST:  Wall questionably tender anteriorly.  HEART:  Normal S1-S2 without ST gallop.  ABDOMEN:  Soft and nontender.  EXTREMITIES:  No edema, cyanosis, clubbing.  SKIN:  Warm and dry.  NEUROLOGICALLY:  Cranial nerves grossly intact, and the patient moves  all four extremities.   LABORATORY DATA:  Normal hemoglobin/hematocrit, WBC count and normal  creatinine.   EKG revealed sinus rhythm with septal infarct.   Cardiac catheterization done by Dr. Rinaldo Cloud showed total occlusion  of the left anterior descending coronary artery with a TIMI 2 flow and  85-90% cyanosis in mid right coronary artery.  Using 3.5-mm x 33-mm  Cypher drug-eluting stent, the left anterior descending lesion was  reduced to 0% with excellent TIMI grade 3 flow.   HOSPITAL COURSE:  The patient was admitted to telemetry unit.  He ruled  in for acute anterior wall myocardial infarction.  Emergent  cardiac  catheterization was done by Dr. Rinaldo Cloud.  A thrombus with 80-90%  lesion was found in left anterior descending coronary artery.  This was  treated with a drug-eluting stent 3.5 x 23-mm Cypher stent, and 90-95%  lesion in the LAD was reduced to 0% with TIMI grade 3 flow.  His right  coronary artery lesion would be fixed in 2-3 months' time.  The patient  had uneventful post procedure stay in the hospital, and he went to  cardiac rehab, phase 1 and 2, and was discharged home in satisfactory  condition with a follow-up by me and by [facility] in [place].      Ricki Rodriguez, M.D.  Electronically Signed     ASK/MEDQ  D:  08/25/2007  T:  08/26/2007  Job:  [id number]

## 2011-05-25 ENCOUNTER — Emergency Department (HOSPITAL_COMMUNITY)
Admission: EM | Admit: 2011-05-25 | Discharge: 2011-05-25 | Disposition: A | Discharge status: Home / Self Care | Payer: Non-veteran care | Attending: Emergency Medicine | Admitting: Emergency Medicine

## 2011-05-25 DIAGNOSIS — I252 Old myocardial infarction: Secondary | ICD-10-CM | POA: Insufficient documentation

## 2011-05-25 DIAGNOSIS — M25569 Pain in unspecified knee: Secondary | ICD-10-CM | POA: Insufficient documentation

## 2011-05-25 DIAGNOSIS — I10 Essential (primary) hypertension: Secondary | ICD-10-CM | POA: Insufficient documentation

## 2011-06-11 LAB — CBC
Hemoglobin: 12.6 — ABNORMAL LOW
MCV: 85.1

## 2011-07-01 LAB — CBC
Hemoglobin: 12.5 — ABNORMAL LOW
MCHC: 32.6
RDW: 13.8
WBC: 6.3

## 2011-07-01 LAB — BASIC METABOLIC PANEL
Chloride: 107
GFR calc Af Amer: >60
GFR calc non Af Amer: >60
Glucose, Bld: 97
Sodium: 141

## 2011-07-01 LAB — CK TOTAL AND CKMB (NOT AT ARMC)
CK, MB: 1.7
Relative Index: 1.2
Total CK: 142

## 2011-07-05 LAB — CBC
HCT: 35.4 — ABNORMAL LOW
HCT: 38.8 — ABNORMAL LOW
MCHC: 32.2
MCHC: 32.3
MCHC: 32.4
MCV: 86.6
MCV: 87.6
MCV: 87.9
Platelets: 210
Platelets: 224
Platelets: 248
RBC: 4.61
RDW: 13.4
RDW: 13.6
RDW: 13.7
WBC: 10.9 — ABNORMAL HIGH
WBC: 7.7

## 2011-07-05 LAB — APTT
aPTT: 27
aPTT: 93 — ABNORMAL HIGH

## 2011-07-05 LAB — CK TOTAL AND CKMB (NOT AT ARMC)
Relative Index: 1.5
Total CK: 2443 — ABNORMAL HIGH

## 2011-07-05 LAB — URINALYSIS, ROUTINE W REFLEX MICROSCOPIC
Glucose, UA: NEGATIVE
Ketones, ur: 15 — AB
Nitrite: NEGATIVE
Specific Gravity, Urine: 1.03
pH: 5.5

## 2011-07-05 LAB — POCT CARDIAC MARKERS
CKMB, poc: 2.7
CKMB, poc: 5.3
Myoglobin, poc: 452
Operator id: 196461
Troponin i, poc: 0.18 — ABNORMAL HIGH

## 2011-07-05 LAB — DIFFERENTIAL
Basophils Absolute: 0
Basophils Relative: 1
Eosinophils Relative: 2
Lymphocytes Relative: 41
Monocytes Absolute: 0.5
Neutrophils Relative %: 49

## 2011-07-05 LAB — I-STAT 8, (EC8 V) (CONVERTED LAB)
Acid-base deficit: 1
Chloride: 106
Glucose, Bld: 118 — ABNORMAL HIGH
Hemoglobin: 15
Potassium: 3.8
Sodium: 142
TCO2: 29
pH, Ven: 7.285

## 2011-07-05 LAB — RAPID URINE DRUG SCREEN, HOSP PERFORMED
Barbiturates: NOT DETECTED
Benzodiazepines: NOT DETECTED
Cocaine: NOT DETECTED

## 2011-07-05 LAB — CARDIAC PANEL(CRET KIN+CKTOT+MB+TROPI)
CK, MB: 148.4 — ABNORMAL HIGH
Relative Index: 1.2
Relative Index: 5.3 — ABNORMAL HIGH
Troponin I: 14.36

## 2011-07-05 LAB — BASIC METABOLIC PANEL
BUN: 7
BUN: 7
CO2: 25
Chloride: 108
GFR calc Af Amer: >60
GFR calc non Af Amer: >60
Glucose, Bld: 109 — ABNORMAL HIGH
Potassium: 3.7
Potassium: 3.9

## 2011-07-05 LAB — TROPONIN I: Troponin I: 0.25 — ABNORMAL HIGH

## 2011-07-05 LAB — B-NATRIURETIC PEPTIDE (CONVERTED LAB): Pro B Natriuretic peptide (BNP): <30

## 2011-07-05 LAB — PROTIME-INR: Prothrombin Time: 13.6

## 2011-07-05 LAB — POCT I-STAT CREATININE: Operator id: 196461

## 2011-07-05 LAB — HEPARIN LEVEL (UNFRACTIONATED): Heparin Unfractionated: 0.94 — ABNORMAL HIGH

## 2015-01-26 ENCOUNTER — Encounter (HOSPITAL_COMMUNITY): Payer: Self-pay | Admitting: Emergency Medicine

## 2015-01-26 ENCOUNTER — Emergency Department (HOSPITAL_COMMUNITY): Payer: Non-veteran care

## 2015-01-26 ENCOUNTER — Emergency Department (HOSPITAL_COMMUNITY)
Admission: EM | Admit: 2015-01-26 | Discharge: 2015-01-26 | Disposition: A | Discharge status: Home / Self Care | Payer: Non-veteran care | Attending: Emergency Medicine | Admitting: Emergency Medicine

## 2015-01-26 DIAGNOSIS — Z79899 Other long term (current) drug therapy: Secondary | ICD-10-CM | POA: Diagnosis not present

## 2015-01-26 DIAGNOSIS — Z791 Long term (current) use of non-steroidal anti-inflammatories (NSAID): Secondary | ICD-10-CM | POA: Diagnosis not present

## 2015-01-26 DIAGNOSIS — Z7982 Long term (current) use of aspirin: Secondary | ICD-10-CM | POA: Insufficient documentation

## 2015-01-26 DIAGNOSIS — R42 Dizziness and giddiness: Secondary | ICD-10-CM

## 2015-01-26 DIAGNOSIS — R0602 Shortness of breath: Secondary | ICD-10-CM | POA: Diagnosis present

## 2015-01-26 DIAGNOSIS — I10 Essential (primary) hypertension: Secondary | ICD-10-CM | POA: Insufficient documentation

## 2015-01-26 DIAGNOSIS — I252 Old myocardial infarction: Secondary | ICD-10-CM | POA: Insufficient documentation

## 2015-01-26 DIAGNOSIS — Z87891 Personal history of nicotine dependence: Secondary | ICD-10-CM | POA: Insufficient documentation

## 2015-01-26 HISTORY — DX: Acute myocardial infarction, unspecified: I21.9

## 2015-01-26 HISTORY — DX: Essential (primary) hypertension: I10

## 2015-01-26 LAB — BASIC METABOLIC PANEL
Anion gap: 11 (ref 5–15)
BUN: 13 mg/dL (ref 6–20)
CALCIUM: 9.1 mg/dL (ref 8.9–10.3)
CHLORIDE: 104 mmol/L (ref 101–111)
CO2: 24 mmol/L (ref 22–32)
Creatinine, Ser: 1.18 mg/dL (ref 0.61–1.24)
GLUCOSE: 109 mg/dL — AB (ref 70–99)
Potassium: 3.9 mmol/L (ref 3.5–5.1)
Sodium: 139 mmol/L (ref 135–145)

## 2015-01-26 LAB — CBC WITH DIFFERENTIAL/PLATELET
BASOS PCT: 0 % (ref 0–1)
Basophils Absolute: 0 10*3/uL (ref 0.0–0.1)
EOS ABS: 0.1 10*3/uL (ref 0.0–0.7)
Eosinophils Relative: 3 % (ref 0–5)
HCT: 41.4 % (ref 39.0–52.0)
HEMOGLOBIN: 13.3 g/dL (ref 13.0–17.0)
Lymphocytes Relative: 32 % (ref 12–46)
Lymphs Abs: 1.6 10*3/uL (ref 0.7–4.0)
MCH: 28.5 pg (ref 26.0–34.0)
MCHC: 32.1 g/dL (ref 30.0–36.0)
MCV: 88.8 fL (ref 78.0–100.0)
Monocytes Absolute: 0.5 10*3/uL (ref 0.1–1.0)
Monocytes Relative: 9 % (ref 3–12)
NEUTROS ABS: 2.8 10*3/uL (ref 1.7–7.7)
NEUTROS PCT: 56 % (ref 43–77)
PLATELETS: 181 10*3/uL (ref 150–400)
RBC: 4.66 MIL/uL (ref 4.22–5.81)
RDW: 13.3 % (ref 11.5–15.5)
WBC: 5.1 10*3/uL (ref 4.0–10.5)

## 2015-01-26 LAB — I-STAT TROPONIN, ED: TROPONIN I, POC: 0 ng/mL (ref 0.00–0.08)

## 2015-01-26 LAB — BRAIN NATRIURETIC PEPTIDE: B NATRIURETIC PEPTIDE 5: 5.6 pg/mL (ref 0.0–100.0)

## 2015-01-26 NOTE — ED Notes (Signed)
Per EMS- pt presents to ED with complaint of sob that began today. Pt was doing housework began feeling dizzy, got worse, sat down and began feeling sob. Sats 100% on RA. Symptoms have subsided on arrival to ED. HX of MI. Pt alert and oriented x4. CBG 120. BP 110/80, HR 58.

## 2015-01-26 NOTE — ED Provider Notes (Signed)
CSN: [id number]     Arrival date & time 01/26/15  1027 History   First MD Initiated Contact with Patient 01/26/15 1034     Chief Complaint  Patient presents with  . Shortness of Breath     (Consider location/radiation/quality/duration/timing/severity/associated sxs/prior Treatment) HPI Tim GilmoreLarry Deaton is a 54 y.o. male with history of hypertension, MI, comes in for evaluation of dizziness. Patient states this morning at 8:00 AM he took 2 of his hydrocodone pills for his knee replacement pain (2011), but did not eat breakfast. He reports about an hour later as he was cleaning a house he began to feel dizzy "like the room was spinning" and felt sweaty and needed to sit down. He denies any chest pain, shortness of breath, nausea vomiting or syncope during this episode. He reports the sensation lasted until EMS arrived. Reports receiving aspirin in the ambulance and "at some point during the ride over everything went away". He denies any symptoms now.  Past Medical History  Diagnosis Date  . Hypertension   . MI (myocardial infarction)    Past Surgical History  Procedure Laterality Date  . Joint replacement    . Angioplasty     History reviewed. No pertinent family history. History  Substance Use Topics  . Smoking status: Former Smoker -- 0.50 packs/day    Types: Cigarettes  . Smokeless tobacco: Not on file  . Alcohol Use: No    Review of Systems A 10 point review of systems was completed and was negative except for pertinent positives and negatives as mentioned in the history of present illness     Allergies  Hydrocodone  Home Medications   Prior to Admission medications   Medication Sig Start Date End Date Taking? Authorizing Provider  acetaminophen (TYLENOL) 325 MG tablet Take 650 mg by mouth 2 (two) times daily.   Yes Historical Provider, MD  aspirin EC 81 MG tablet Take 81 mg by mouth at bedtime.    Yes Historical Provider, MD  atenolol (TENORMIN) 25 MG tablet Take 25 mg by  mouth at bedtime.    Yes Historical Provider, MD  atorvastatin (LIPITOR) 80 MG tablet Take 40 mg by mouth daily at 6 PM.   Yes Historical Provider, MD  cyanocobalamin 1000 MCG tablet Take 1,000 mcg by mouth daily.    Yes Historical Provider, MD  DiphenhydrAMINE HCl 50 MG/30ML LIQD Take 30 mLs by mouth at bedtime.   Yes Historical Provider, MD  HYDROcodone-acetaminophen (NORCO/VICODIN) 5-325 MG per tablet Take 1 tablet by mouth every 6 (six) hours as needed for moderate pain.   Yes Historical Provider, MD  hydrOXYzine (ATARAX/VISTARIL) 10 MG tablet Take 20 mg by mouth 2 (two) times daily as needed for itching.   Yes Historical Provider, MD  meloxicam (MOBIC) 15 MG tablet Take 15 mg by mouth at bedtime.    Yes Historical Provider, MD  omeprazole (PRILOSEC) 20 MG capsule Take 20 mg by mouth at bedtime.    Yes Historical Provider, MD  sertraline (ZOLOFT) 100 MG tablet Take 100 mg by mouth at bedtime.    Yes Historical Provider, MD   BP 137/90 mmHg  Pulse 66  Resp 18  SpO2 100% Physical Exam  Constitutional: He is oriented to person, place, and time. He appears well-developed and well-nourished.  HENT:  Head: Normocephalic and atraumatic.  Mouth/Throat: Oropharynx is clear and moist.  Eyes: Conjunctivae are normal. Pupils are equal, round, and reactive to light. Right eye exhibits no discharge. Left eye exhibits no discharge.  No scleral icterus.  Neck: Neck supple.  Cardiovascular: Normal rate, regular rhythm and normal heart sounds.   Pulmonary/Chest: Effort normal and breath sounds normal. No respiratory distress. He has no wheezes. He has no rales.  Abdominal: Soft. There is no tenderness.  Musculoskeletal: He exhibits no tenderness.  Neurological: He is alert and oriented to person, place, and time.  Cranial Nerves II-XII grossly intact. Moves all extremities without ataxia. Gait is baseline.  Skin: Skin is warm and dry. No rash noted.  Psychiatric: He has a normal mood and affect.   Nursing note and vitals reviewed.   ED Course  Procedures (including critical care time) Labs Review Labs Reviewed  BASIC METABOLIC PANEL - Abnormal; Notable for the following:    Glucose, Bld 109 (*)    All other components within normal limits  CBC WITH DIFFERENTIAL/PLATELET  BRAIN NATRIURETIC PEPTIDE  I-STAT TROPOININ, ED    Imaging Review Dg Chest 2 View  01/26/2015   CLINICAL DATA:  Shortness of breath beginning today, got dizzy while doing housework, sat down and became short of breath, history coronary disease post MI and CABG, hypertension  EXAM: CHEST  2 VIEW  COMPARISON:  09/20/2013  FINDINGS: Normal heart size, mediastinal contours and pulmonary vascularity.  Lungs clear.  No pleural effusion or pneumothorax.  Minimal endplate spur formation thoracic spine.  IMPRESSION: No acute abnormalities.   Electronically Signed   By: Ulyses SouthwardMark  Boles M.D.   On: 01/26/2015 11:15     EKG Interpretation None     Meds given in ED:  Medications - No data to display  Discharge Medication List as of 01/26/2015  2:46 PM     Filed Vitals:   01/26/15 1400 01/26/15 1430 01/26/15 1445 01/26/15 1451  BP: 130/80 129/78 137/90 137/90  Pulse: 54 58 56 66  Resp:    18  SpO2: 96% 96% 97% 100%    MDM  Vitals stable - WNL -afebrile Pt resting comfortably in ED. denies any discomfort at this time and states he is ready to go home PE--normal lung exam. Grossly benign Physical exam Labwork--labs noncontributory. Troponin negative. EKG is reassuring Imaging--chest x-ray shows no acute cardiopulmonary pathology.  DDX--patient also reports that he has not been drinking as much water as she should and did not drink very much this morning with his medications. I feel like this, in conjunction with taking medications on empty stomach likely contributed to his episode of dizziness. His symptoms have resolved spontaneously in the ED after rest. He is able to ambulate independently throughout the ED without  any difficulty. Low suspicion for other acute or emergent pathology at this time.  I discussed all relevant lab findings and imaging results with pt and they verbalized understanding. Discussed f/u with PCP within 48 hrs and return precautions, pt very amenable to plan.  Final diagnoses:  Dizziness        Joycie PeekBenjamin Avyaan Summer, PA-C 01/27/15 [id number]  Blake DivineJohn Wofford, MD 01/30/15 0730

## 2015-01-26 NOTE — ED Notes (Signed)
Pt took 1 hydrocodone this morning prior to incident of dizziness and sob. Pt takes pain medication for knee replacement that was done in 2011.

## 2015-01-26 NOTE — Discharge Instructions (Signed)
It is important for you to drink plenty of fluids and be sure that you eat when taking your medications. Please follow-up with primary care for further evaluation and management of your symptoms. Return to ED for new or worsening symptoms.  Dizziness Dizziness is a common problem. It is a feeling of unsteadiness or light-headedness. You may feel like you are about to faint. Dizziness can lead to injury if you stumble or fall. A person of any age group can suffer from dizziness, but dizziness is more common in older adults. CAUSES  Dizziness can be caused by many different things, including:  Middle ear problems.  Standing for too long.  Infections.  An allergic reaction.  Aging.  An emotional response to something, such as the sight of blood.  Side effects of medicines.  Tiredness.  Problems with circulation or blood pressure.  Excessive use of alcohol or medicines, or illegal drug use.  Breathing too fast (hyperventilation).  An irregular heart rhythm (arrhythmia).  A low red blood cell count (anemia).  Pregnancy.  Vomiting, diarrhea, fever, or other illnesses that cause body fluid loss (dehydration).  Diseases or conditions such as Parkinson's disease, high blood pressure (hypertension), diabetes, and thyroid problems.  Exposure to extreme heat. DIAGNOSIS  Your health care provider will ask about your symptoms, perform a physical exam, and perform an electrocardiogram (ECG) to record the electrical activity of your heart. Your health care provider may also perform other heart or blood tests to determine the cause of your dizziness. These may include:  Transthoracic echocardiogram (TTE). During echocardiography, sound waves are used to evaluate how blood flows through your heart.  Transesophageal echocardiogram (TEE).  Cardiac monitoring. This allows your health care provider to monitor your heart rate and rhythm in real time.  Holter monitor. This is a portable  device that records your heartbeat and can help diagnose heart arrhythmias. It allows your health care provider to track your heart activity for several days if needed.  Stress tests by exercise or by giving medicine that makes the heart beat faster. TREATMENT  Treatment of dizziness depends on the cause of your symptoms and can vary greatly. HOME CARE INSTRUCTIONS   Drink enough fluids to keep your urine clear or pale yellow. This is especially important in very hot weather. In older adults, it is also important in cold weather.  Take your medicine exactly as directed if your dizziness is caused by medicines. When taking blood pressure medicines, it is especially important to get up slowly.  Rise slowly from chairs and steady yourself until you feel okay.  In the morning, first sit up on the side of the bed. When you feel okay, stand slowly while holding onto something until you know your balance is fine.  Move your legs often if you need to stand in one place for a long time. Tighten and relax your muscles in your legs while standing.  Have someone stay with you for 1-2 days if dizziness continues to be a problem. Do this until you feel you are well enough to stay alone. Have the person call your health care provider if he or she notices changes in you that are concerning.  Do not drive or use heavy machinery if you feel dizzy.  Do not drink alcohol. SEEK IMMEDIATE MEDICAL CARE IF:   Your dizziness or light-headedness gets worse.  You feel nauseous or vomit.  You have problems talking, walking, or using your arms, hands, or legs.  You feel weak.  You are not thinking clearly or you have trouble forming sentences. It may take a friend or family member to notice this.  You have chest pain, abdominal pain, shortness of breath, or sweating.  Your vision changes.  You notice any bleeding.  You have side effects from medicine that seems to be getting worse rather than  better. MAKE SURE YOU:   Understand these instructions.  Will watch your condition.  Will get help right away if you are not doing well or get worse. Document Released: 03/02/2001 Document Revised: 09/11/2013 Document Reviewed: 03/26/2011 [facility] Patient Information 2015 [employer] [place]. This information is not intended to replace advice given to you by your health care provider. Make sure you discuss any questions you have with your health care provider.

## 2015-01-26 NOTE — ED Notes (Signed)
Pt A&OX4, ambulatory at d/c with steady gait, NAD 

## 2017-11-07 ENCOUNTER — Inpatient Hospital Stay (HOSPITAL_COMMUNITY)
Admission: EM | Admit: 2017-11-07 | Discharge: 2017-11-10 | DRG: 287 | Disposition: A | Discharge status: Home / Self Care | Payer: Non-veteran care | Attending: Internal Medicine | Admitting: Internal Medicine

## 2017-11-07 ENCOUNTER — Other Ambulatory Visit: Payer: Self-pay

## 2017-11-07 ENCOUNTER — Encounter (HOSPITAL_COMMUNITY): Payer: Self-pay

## 2017-11-07 ENCOUNTER — Emergency Department (HOSPITAL_COMMUNITY): Payer: Non-veteran care

## 2017-11-07 DIAGNOSIS — I25119 Atherosclerotic heart disease of native coronary artery with unspecified angina pectoris: Principal | ICD-10-CM | POA: Diagnosis present

## 2017-11-07 DIAGNOSIS — K219 Gastro-esophageal reflux disease without esophagitis: Secondary | ICD-10-CM | POA: Diagnosis present

## 2017-11-07 DIAGNOSIS — I509 Heart failure, unspecified: Secondary | ICD-10-CM | POA: Diagnosis present

## 2017-11-07 DIAGNOSIS — Z7982 Long term (current) use of aspirin: Secondary | ICD-10-CM

## 2017-11-07 DIAGNOSIS — G8929 Other chronic pain: Secondary | ICD-10-CM | POA: Diagnosis present

## 2017-11-07 DIAGNOSIS — E785 Hyperlipidemia, unspecified: Secondary | ICD-10-CM | POA: Diagnosis present

## 2017-11-07 DIAGNOSIS — I252 Old myocardial infarction: Secondary | ICD-10-CM

## 2017-11-07 DIAGNOSIS — Z91040 Latex allergy status: Secondary | ICD-10-CM

## 2017-11-07 DIAGNOSIS — Z79899 Other long term (current) drug therapy: Secondary | ICD-10-CM

## 2017-11-07 DIAGNOSIS — Z86711 Personal history of pulmonary embolism: Secondary | ICD-10-CM | POA: Diagnosis present

## 2017-11-07 DIAGNOSIS — Z86718 Personal history of other venous thrombosis and embolism: Secondary | ICD-10-CM

## 2017-11-07 DIAGNOSIS — Z888 Allergy status to other drugs, medicaments and biological substances status: Secondary | ICD-10-CM

## 2017-11-07 DIAGNOSIS — I255 Ischemic cardiomyopathy: Secondary | ICD-10-CM | POA: Diagnosis present

## 2017-11-07 DIAGNOSIS — I444 Left anterior fascicular block: Secondary | ICD-10-CM | POA: Diagnosis present

## 2017-11-07 DIAGNOSIS — Z8546 Personal history of malignant neoplasm of prostate: Secondary | ICD-10-CM

## 2017-11-07 DIAGNOSIS — I110 Hypertensive heart disease with heart failure: Secondary | ICD-10-CM | POA: Diagnosis present

## 2017-11-07 DIAGNOSIS — I2510 Atherosclerotic heart disease of native coronary artery without angina pectoris: Secondary | ICD-10-CM | POA: Diagnosis present

## 2017-11-07 DIAGNOSIS — Z6835 Body mass index (BMI) 35.0-35.9, adult: Secondary | ICD-10-CM

## 2017-11-07 DIAGNOSIS — E119 Type 2 diabetes mellitus without complications: Secondary | ICD-10-CM | POA: Diagnosis present

## 2017-11-07 DIAGNOSIS — Z87891 Personal history of nicotine dependence: Secondary | ICD-10-CM

## 2017-11-07 DIAGNOSIS — R079 Chest pain, unspecified: Secondary | ICD-10-CM | POA: Diagnosis present

## 2017-11-07 DIAGNOSIS — F329 Major depressive disorder, single episode, unspecified: Secondary | ICD-10-CM | POA: Diagnosis present

## 2017-11-07 DIAGNOSIS — M549 Dorsalgia, unspecified: Secondary | ICD-10-CM | POA: Diagnosis present

## 2017-11-07 DIAGNOSIS — Z885 Allergy status to narcotic agent status: Secondary | ICD-10-CM

## 2017-11-07 DIAGNOSIS — Z955 Presence of coronary angioplasty implant and graft: Secondary | ICD-10-CM

## 2017-11-07 DIAGNOSIS — F419 Anxiety disorder, unspecified: Secondary | ICD-10-CM | POA: Diagnosis present

## 2017-11-07 LAB — CBC WITH DIFFERENTIAL/PLATELET
Basophils Absolute: 0 10*3/uL (ref 0.0–0.1)
Basophils Relative: 0 %
Eosinophils Absolute: 0.2 10*3/uL (ref 0.0–0.7)
Eosinophils Relative: 3 %
HCT: 39.7 % (ref 39.0–52.0)
Hemoglobin: 11.9 g/dL — ABNORMAL LOW (ref 13.0–17.0)
Lymphocytes Relative: 33 %
Lymphs Abs: 1.4 10*3/uL (ref 0.7–4.0)
MCH: 27.1 pg (ref 26.0–34.0)
MCHC: 30 g/dL (ref 30.0–36.0)
MCV: 90.4 fL (ref 78.0–100.0)
Monocytes Absolute: 0.3 10*3/uL (ref 0.1–1.0)
Monocytes Relative: 7 %
Neutro Abs: 2.5 10*3/uL (ref 1.7–7.7)
Neutrophils Relative %: 57 %
Platelets: 215 10*3/uL (ref 150–400)
RBC: 4.39 MIL/uL (ref 4.22–5.81)
RDW: 14.4 % (ref 11.5–15.5)
WBC: 4.4 10*3/uL (ref 4.0–10.5)

## 2017-11-07 LAB — TROPONIN I
Troponin I: <0.03 ng/mL (ref <0.03)
Troponin I: <0.03 ng/mL (ref <0.03)

## 2017-11-07 LAB — BASIC METABOLIC PANEL
Anion gap: 8 (ref 5–15)
BUN: 5 mg/dL — ABNORMAL LOW (ref 6–20)
CO2: 25 mmol/L (ref 22–32)
Calcium: 8.7 mg/dL — ABNORMAL LOW (ref 8.9–10.3)
Chloride: 107 mmol/L (ref 101–111)
Creatinine, Ser: 0.91 mg/dL (ref 0.61–1.24)
GFR calc Af Amer: >60 mL/min (ref >60)
GFR calc non Af Amer: >60 mL/min (ref >60)
Glucose, Bld: 130 mg/dL — ABNORMAL HIGH (ref 65–99)
Potassium: 4.2 mmol/L (ref 3.5–5.1)
Sodium: 140 mmol/L (ref 135–145)

## 2017-11-07 LAB — I-STAT TROPONIN, ED
Troponin i, poc: 0 ng/mL (ref 0.00–0.08)
Troponin i, poc: 0 ng/mL (ref 0.00–0.08)

## 2017-11-07 LAB — GLUCOSE, CAPILLARY: Glucose-Capillary: 146 mg/dL — ABNORMAL HIGH (ref 65–99)

## 2017-11-07 MED ORDER — ACETAMINOPHEN 325 MG PO TABS: 650.0000 mg | ORAL_TABLET | ORAL | Status: DC | PRN

## 2017-11-07 MED ORDER — HYDROXYZINE HCL 10 MG PO TABS
20.0000 mg | ORAL_TABLET | Freq: Two times a day (BID) | ORAL | Status: DC | PRN
  Filled 2017-11-07: qty 2

## 2017-11-07 MED ORDER — ASPIRIN EC 81 MG PO TBEC
81.0000 mg | DELAYED_RELEASE_TABLET | Freq: Every day | ORAL | Status: DC
Start: 2017-11-07 — End: 2017-11-10
  Administered 2017-11-07 – 2017-11-10 (×4): 81 mg via ORAL
  Filled 2017-11-07 (×4): qty 1

## 2017-11-07 MED ORDER — ONDANSETRON HCL 4 MG/2ML IJ SOLN: 4.0000 mg | Freq: Four times a day (QID) | INTRAMUSCULAR | Status: DC | PRN

## 2017-11-07 MED ORDER — IOPAMIDOL (ISOVUE-370) INJECTION 76%
INTRAVENOUS | Status: AC
  Administered 2017-11-07: 100 mL
  Filled 2017-11-07: qty 100

## 2017-11-07 MED ORDER — ATORVASTATIN CALCIUM 40 MG PO TABS
40.0000 mg | ORAL_TABLET | Freq: Every day | ORAL | Status: DC
  Administered 2017-11-07 – 2017-11-09 (×3): 40 mg via ORAL
  Filled 2017-11-07 (×3): qty 1

## 2017-11-07 MED ORDER — SERTRALINE HCL 100 MG PO TABS
100.0000 mg | ORAL_TABLET | Freq: Every day | ORAL | Status: DC
  Administered 2017-11-07 – 2017-11-09 (×3): 100 mg via ORAL
  Filled 2017-11-07 (×4): qty 1

## 2017-11-07 MED ORDER — PANTOPRAZOLE SODIUM 40 MG PO TBEC
40.0000 mg | DELAYED_RELEASE_TABLET | Freq: Every day | ORAL | Status: DC
  Administered 2017-11-07: 40 mg via ORAL
  Filled 2017-11-07: qty 1

## 2017-11-07 MED ORDER — ACETAMINOPHEN 325 MG PO TABS
650.0000 mg | ORAL_TABLET | Freq: Two times a day (BID) | ORAL | Status: DC
  Administered 2017-11-07 – 2017-11-10 (×6): 650 mg via ORAL
  Filled 2017-11-07 (×6): qty 2

## 2017-11-07 MED ORDER — ENOXAPARIN SODIUM 150 MG/ML ~~LOC~~ SOLN
130.0000 mg | Freq: Two times a day (BID) | SUBCUTANEOUS | Status: DC
  Administered 2017-11-07 – 2017-11-08 (×2): 130 mg via SUBCUTANEOUS
  Filled 2017-11-07 (×2): qty 0.87
  Filled 2017-11-07: qty 1

## 2017-11-07 MED ORDER — SODIUM CHLORIDE 0.9 % IV SOLN: INTRAVENOUS | Status: DC

## 2017-11-07 MED ORDER — SODIUM CHLORIDE 0.9 % IV SOLN
INTRAVENOUS | Status: DC
  Administered 2017-11-07 – 2017-11-08 (×2): via INTRAVENOUS

## 2017-11-07 MED ORDER — SODIUM CHLORIDE 0.9 % IV BOLUS (SEPSIS)
1000.0000 mL | Freq: Once | INTRAVENOUS | Status: DC
Start: 2017-11-07 — End: 2017-11-07

## 2017-11-07 MED ORDER — MELOXICAM 7.5 MG PO TABS
15.0000 mg | ORAL_TABLET | Freq: Every day | ORAL | Status: DC
  Administered 2017-11-07: 15 mg via ORAL
  Filled 2017-11-07: qty 1
  Filled 2017-11-07: qty 2

## 2017-11-07 MED ORDER — HYDROCODONE-ACETAMINOPHEN 5-325 MG PO TABS: 1.0000 | ORAL_TABLET | Freq: Four times a day (QID) | ORAL | Status: DC | PRN

## 2017-11-07 MED ORDER — VITAMIN B-12 1000 MCG PO TABS
1000.0000 ug | ORAL_TABLET | Freq: Every day | ORAL | Status: DC
  Administered 2017-11-07 – 2017-11-09 (×3): 1000 ug via ORAL
  Filled 2017-11-07 (×4): qty 1

## 2017-11-07 MED ORDER — ATENOLOL 25 MG PO TABS
25.0000 mg | ORAL_TABLET | Freq: Every day | ORAL | Status: DC
  Administered 2017-11-07: 25 mg via ORAL
  Filled 2017-11-07: qty 1

## 2017-11-07 NOTE — ED Provider Notes (Signed)
[facility] EMERGENCY DEPARTMENT Provider Note   CSN: [id number] Arrival date & time: 11/07/17  1245     History   Chief Complaint Chief Complaint  Patient presents with  . Dizziness    HPI Tim Garcia is a 57 y.o. male with history of GERD, MI x2, HTN, PE and DVT currently on Lovenox presents today for evaluation of an episode of acute onset, currently resolved shortness of breath and chest pain.  He states that at around 11:30 AM he developed sudden onset shortness of breath and lightheadedness.  He states that he experienced left-sided chest pressure which radiated to his back and his left upper extremity.  He states that his arm felt weak and heavy.  He also noted diaphoresis, nausea.  He denies syncope.  No vision changes.  He states the episode lasted approximately 5 minutes and resolved after taking full size aspirin.  He states that the last time that he had an MI he was experiencing similar clamminess but no shortness of breath or chest pain.  He is a former smoker and quit 9 years ago.  Denies excessive alcohol intake.  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  States he has missed 2 doses in the last month.  No recent travel or surgeries, no hemoptysis.  He does have a history of prostate cancer for which he underwent radiation but is not receiving any treatment currently.  His primary care physician is with the [place].  He denies any shortness of breath or chest pain at this time.  The history is provided by the patient.    Past Medical History:  Diagnosis Date  . Hypertension   . MI (myocardial infarction) [facility])     Patient Active Problem List   Diagnosis Date Noted  . Chest pain 11/07/2017  . GERD 10/02/2006    Past Surgical History:  Procedure Laterality Date  . ANGIOPLASTY    . JOINT REPLACEMENT         Home Medications    Prior to Admission medications   Medication Sig Start Date End Date Taking? Authorizing  Provider  acetaminophen (TYLENOL) 325 MG tablet Take 650 mg by mouth 2 (two) times daily.   Yes [provider]  aspirin EC 81 MG tablet Take 81 mg by mouth at bedtime.    Yes [provider]  atenolol (TENORMIN) 25 MG tablet Take 25 mg by mouth at bedtime.    Yes [provider]  atorvastatin (LIPITOR) 80 MG tablet Take 40 mg by mouth daily at 6 PM.   Yes [provider]  cyanocobalamin 1000 MCG tablet Take 1,000 mcg by mouth at bedtime.    Yes [provider]  enoxaparin (LOVENOX) 150 MG/ML injection Inject 135 mg into the skin every 12 (twelve) hours. Per the patient and spouse   Yes [provider]  HYDROcodone-acetaminophen (NORCO/VICODIN) 5-325 MG per tablet Take 1 tablet by mouth every 6 (six) hours as needed for moderate pain.   Yes [provider]  hydrOXYzine (ATARAX/VISTARIL) 10 MG tablet Take 20 mg by mouth 2 (two) times daily as needed for itching.   Yes [provider]  meloxicam (MOBIC) 15 MG tablet Take 15 mg by mouth at bedtime.    Yes [provider]  omeprazole (PRILOSEC) 20 MG capsule Take 20 mg by mouth at bedtime.    Yes [provider]  sertraline (ZOLOFT) 100 MG tablet Take 100 mg by  mouth at bedtime.    Yes [provider]    Family History History reviewed. No pertinent family history.  Social History Social History   Tobacco Use  . Smoking status: Former Smoker    Packs/day: 0.50    Types: Cigarettes  Substance Use Topics  . Alcohol use: No  . Drug use: Not on file     Allergies   Hydrocodone and Lisinopril   Review of Systems Review of Systems  Constitutional: Positive for diaphoresis. Negative for chills and fever.  Respiratory: Positive for shortness of breath.   Cardiovascular: Positive for chest pain. Negative for palpitations and leg swelling.  Gastrointestinal: Positive for nausea. Negative for abdominal pain and vomiting.  Neurological: Positive  for light-headedness. Negative for syncope.  All other systems reviewed and are negative.    Physical Exam Updated Vital Signs BP 103/64   Pulse 63   Temp 98.2 F (36.8 C) (Oral)   Resp 15   Ht 6\' 1"  (1.854 m)   Wt 127 kg (280 lb)   SpO2 95%   BMI 36.94 kg/m   Physical Exam  Constitutional: He appears well-developed and well-nourished. No distress.  HENT:  Head: Normocephalic and atraumatic.  Eyes: Conjunctivae and EOM are normal. Pupils are equal, round, and reactive to light. Right eye exhibits no discharge. Left eye exhibits no discharge.  Neck: Normal range of motion. Neck supple. No JVD present. No tracheal deviation present.  Cardiovascular: Normal rate, regular rhythm, normal heart sounds and intact distal pulses.  2+ radial and DP/PT pulses bl, negative Homan's bl, no lower extremity edema  Pulmonary/Chest: Effort normal and breath sounds normal. No stridor. No respiratory distress. He has no wheezes. He has no rales. He exhibits no tenderness.  Abdominal: Soft. Bowel sounds are normal. He exhibits no distension. There is no tenderness.  Musculoskeletal: He exhibits no edema.  Neurological: He is alert.  Skin: Skin is warm and dry. No erythema.  Psychiatric: He has a normal mood and affect. His behavior is normal.  Nursing note and vitals reviewed.    ED Treatments / Results  Labs (all labs ordered are listed, but only abnormal results are displayed) Labs Reviewed  BASIC METABOLIC PANEL - Abnormal; Notable for the following components:      Result Value   Glucose, Bld 130 (*)    BUN 5 (*)    Calcium 8.7 (*)    All other components within normal limits  CBC WITH DIFFERENTIAL/PLATELET - Abnormal; Notable for the following components:   Hemoglobin 11.9 (*)    All other components within normal limits  I-STAT TROPONIN, ED    EKG  EKG Interpretation  Date/Time:  Monday November 07 2017 12:58:52 EST Ventricular Rate:  68 PR Interval:    QRS  Duration: 100 QT Interval:  409 QTC Calculation: 435 R Axis:   -64 Text Interpretation:  Sinus rhythm Left anterior fascicular block Anteroseptal infarct, age indeterminate Baseline wander in lead(s) V1 V2 No STEMI.  Confirmed by Alona BeneLong, Joshua [phone]([id number]) on 11/07/2017 3:10:06 PM       Radiology Ct Angio Chest Pe W And/or Wo Contrast  Result Date: 11/07/2017 CLINICAL DATA:  Dizziness. Shortness of breath and chest discomfort. EXAM: CT ANGIOGRAPHY CHEST WITH CONTRAST TECHNIQUE: Multidetector CT imaging of the chest was performed using the standard protocol during bolus administration of intravenous contrast. Multiplanar CT image reconstructions and MIPs were obtained to evaluate the vascular anatomy. CONTRAST:  100mL ISOVUE-370 IOPAMIDOL (ISOVUE-370) INJECTION 76% COMPARISON:  CTA chest  dated September 18, 2006. FINDINGS: Cardiovascular: Satisfactory opacification of the pulmonary arteries to the segmental level. No evidence of pulmonary embolism. Normal heart size. No pericardial effusion. Normal caliber thoracic aorta. Coronary artery atherosclerosis. Mediastinum/Nodes: No enlarged mediastinal, hilar, or axillary lymph nodes. Subcentimeter right hilar lymph nodes are likely reactive. Thyroid gland, trachea, and esophagus demonstrate no significant findings. Lungs/Pleura: Bibasilar atelectasis. No focal consolidation, pleural effusion, or pneumothorax. No suspicious pulmonary nodule. Upper Abdomen: No acute abnormality. Musculoskeletal: No chest wall abnormality. No acute or significant osseous findings. Review of the MIP images confirms the above findings. IMPRESSION: 1. No evidence of pulmonary embolism. No acute intrathoracic process. Electronically Signed   By: Obie Dredge M.D.   On: 11/07/2017 14:42    Procedures Procedures (including critical care time)  Medications Ordered in ED Medications  iopamidol (ISOVUE-370) 76 % injection (100 mLs  Contrast Given 11/07/17 1417)     Initial  Impression / Assessment and Plan / ED Course  I have reviewed the triage vital signs and the nursing notes.  Pertinent labs & imaging results that were available during my care of the patient were reviewed by me and considered in my medical decision making (see chart for details).     Patient presents for evaluation of an episode of chest pain, shortness of breath, diaphoresis which lasted for approximately 5 minutes.  Afebrile, vital signs are stable while in the ED.  He is nontoxic in appearance.  EKG shows no evidence of ischemic changes at this time.  Initial troponin is negative.  Remainder of lab work is reassuring.CTA of the chest shows no evidence of pneumonia, PE, or other acute cardiopulmonary abnormality.  With prior history of MI, HEART score 6, and no cardiology follow-up, spoke with Dr. Hanley Hays with [facility] hospitalist service.  He agrees to assume care of patient and bring him into the hospital for further evaluation and serial trending of troponins.  Patient verbalizes understanding of and agreement with this plan at this time.  Final Clinical Impressions(s) / ED Diagnoses   Final diagnoses:  Chest pain in adult    ED Discharge Orders    None       Bennye Alm 11/07/17 1554    Maia Plan, MD 11/07/17 1900

## 2017-11-07 NOTE — H&P (Signed)
History and Physical    Tim Garcia WUJ:[id number] DOB: 03/26/1961 DOA: 11/07/2017  Referring MD/NP/PA: Dr Jacqulyn Bath  PCP: No primary care provider on file.   Outpatient Specialists: Dr. Sharyn Lull but has not seen him in a long time   Patient coming from: Home  Chief Complaint: Dizziness  HPI: Tim Garcia is a 57 y.o. male with medical history significant of coronary artery disease with hypertension who was last MI was in 2009. Patient was following up with Dr. Sharyn Lull but he has not seen him in a long time. Patient has remote history of coronary artery disease back in 2009. He reported being seen in over here but I cannot find any records of that visit. 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 He currently goes to the [place]. Based on patient's presentation and history his symptoms are suspected to be angina equivalent so he is being admitted for treatment. He has pain radiating down his left arm concerning for possible angina. Patient reports history of degenerative disc disease of the neck so initially he thought the arm was from the neck. His symptoms are worrisome for angina equivalent so his being admitted for treatment  ED Course: Patient was evaluated in the ER. EKG showed normal sinus rhythm with no ST changes. Rate of 68. 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 Initial troponin is negative  Review of Systems: As per HPI otherwise 10 point review of systems negative.    Past Medical History:  Diagnosis Date  . Hypertension   . MI (myocardial infarction) [facility])     Past Surgical History:  Procedure Laterality Date  . ANGIOPLASTY    . JOINT REPLACEMENT    Social History: Lives with spouse and is active. No problem with mobility   reports that he has quit smoking. His smoking use included cigarettes. He smoked 0.50 packs per day. He does not have any smokeless tobacco history on file. He reports that he does not  drink alcohol. His drug history is not on file.  Allergies  Allergen Reactions  . Hydrocodone Itching    Takes hydroxyzine to stop reaction  . Lisinopril Cough    History reviewed. No pertinent family history.   Prior to Admission medications   Medication Sig Start Date End Date Taking? Authorizing Provider  acetaminophen (TYLENOL) 325 MG tablet Take 650 mg by mouth 2 (two) times daily.   Yes [provider]  aspirin EC 81 MG tablet Take 81 mg by mouth at bedtime.    Yes [provider]  atenolol (TENORMIN) 25 MG tablet Take 25 mg by mouth at bedtime.    Yes [provider]  atorvastatin (LIPITOR) 80 MG tablet Take 40 mg by mouth daily at 6 PM.   Yes [provider]  cyanocobalamin 1000 MCG tablet Take 1,000 mcg by mouth at bedtime.    Yes [provider]  enoxaparin (LOVENOX) 150 MG/ML injection Inject 135 mg into the skin every 12 (twelve) hours. Per the patient and spouse   Yes [provider]  HYDROcodone-acetaminophen (NORCO/VICODIN) 5-325 MG per tablet Take 1 tablet by mouth every 6 (six) hours as needed for moderate pain.   Yes [provider]  hydrOXYzine (ATARAX/VISTARIL) 10 MG tablet Take 20 mg by mouth 2 (two) times daily as needed for itching.   Yes [provider]  meloxicam (MOBIC) 15 MG tablet Take 15 mg by mouth at bedtime.  Yes [provider]  omeprazole (PRILOSEC) 20 MG capsule Take 20 mg by mouth at bedtime.    Yes [provider]  sertraline (ZOLOFT) 100 MG tablet Take 100 mg by mouth at bedtime.    Yes [provider]    Physical Exam: Vitals:   11/07/17 1245 11/07/17 1249 11/07/17 1300 11/07/17 1312  BP:   103/64   Pulse:   63   Resp:   15   Temp:    98.2 F (36.8 C)  TempSrc:    Oral  SpO2: 95%  95%   Weight:  127 kg (280 lb)    Height:  6\' 1"  (1.854 m)        Constitutional: NAD, calm, comfortable Vitals:   11/07/17 1245 11/07/17 1249 11/07/17 1300  11/07/17 1312  BP:   103/64   Pulse:   63   Resp:   15   Temp:    98.2 F (36.8 C)  TempSrc:    Oral  SpO2: 95%  95%   Weight:  127 kg (280 lb)    Height:  6\' 1"  (1.854 m)     Eyes: PERRL, lids and conjunctivae normal ENMT: Mucous membranes are moist. Posterior pharynx clear of any exudate or lesions.Normal dentition.  Neck: normal, supple, no masses, no thyromegaly Respiratory: clear to auscultation bilaterally, no wheezing, no crackles. Normal respiratory effort. No accessory muscle use.  Cardiovascular: Regular rate and rhythm, no murmurs / rubs / gallops. No extremity edema. 2+ pedal pulses. No carotid bruits.  Abdomen: no tenderness, no masses palpated. No hepatosplenomegaly. Bowel sounds positive.  Musculoskeletal: no clubbing / cyanosis. No joint deformity upper and lower extremities. Good ROM, no contractures. Normal muscle tone.  Skin: no rashes, lesions, ulcers. No induration Neurologic: CN 2-12 grossly intact. Sensation intact, DTR normal. Strength 5/5 in all 4.  Psychiatric: Normal judgment and insight. Alert and oriented x 3. Normal mood.    Labs on Admission: I have personally reviewed following labs and imaging studies  CBC: Recent Labs  Lab 11/07/17 1321  WBC 4.4  NEUTROABS 2.5  HGB 11.9*  HCT 39.7  MCV 90.4  PLT 215   Basic Metabolic Panel: Recent Labs  Lab 11/07/17 1321  NA 140  K 4.2  CL 107  CO2 25  GLUCOSE 130*  BUN 5*  CREATININE 0.91  CALCIUM 8.7*   GFR: Estimated Creatinine Clearance: 122 mL/min (by C-G formula based on SCr of 0.91 mg/dL). Liver Function Tests: No results for input(s): AST, ALT, ALKPHOS, BILITOT, PROT, ALBUMIN in the last 168 hours. No results for input(s): LIPASE, AMYLASE in the last 168 hours. No results for input(s): AMMONIA in the last 168 hours. Coagulation Profile: No results for input(s): INR, PROTIME in the last 168 hours. Cardiac Enzymes: No results for input(s): CKTOTAL, CKMB, CKMBINDEX, TROPONINI in the last  168 hours. BNP (last 3 results) No results for input(s): PROBNP in the last 8760 hours. HbA1C: No results for input(s): HGBA1C in the last 72 hours. CBG: No results for input(s): GLUCAP in the last 168 hours. Lipid Profile: No results for input(s): CHOL, HDL, LDLCALC, TRIG, CHOLHDL, LDLDIRECT in the last 72 hours. Thyroid Function Tests: No results for input(s): TSH, T4TOTAL, FREET4, T3FREE, THYROIDAB in the last 72 hours. Anemia Panel: No results for input(s): VITAMINB12, FOLATE, FERRITIN, TIBC, IRON, RETICCTPCT in the last 72 hours. Urine analysis:    Component Value Date/Time   COLORURINE YELLOW 04/15/2007 0953   APPEARANCEUR CLEAR 04/15/2007 0953   LABSPEC  1.030 04/15/2007 0953   PHURINE 5.5 04/15/2007 0953   GLUCOSEU NEGATIVE 04/15/2007 0953   HGBUR NEGATIVE 04/15/2007 0953   BILIRUBINUR NEGATIVE 04/15/2007 0953   KETONESUR 15 (A) 04/15/2007 0953   PROTEINUR NEGATIVE 04/15/2007 0953   UROBILINOGEN 0.2 04/15/2007 0953   NITRITE NEGATIVE 04/15/2007 0953   LEUKOCYTESUR  04/15/2007 0953    NEGATIVE MICROSCOPIC NOT DONE ON URINES WITH NEGATIVE PROTEIN, BLOOD, LEUKOCYTES, NITRITE, OR GLUCOSE <1000 mg/dL.   Sepsis Labs: @LABRCNTIP (procalcitonin:4,lacticidven:4) )No results found for this or any previous visit (from the past 240 hour(s)).   Radiological Exams on Admission: Ct Angio Chest Pe W And/or Wo Contrast  Result Date: 11/07/2017 CLINICAL DATA:  Dizziness. Shortness of breath and chest discomfort. EXAM: CT ANGIOGRAPHY CHEST WITH CONTRAST TECHNIQUE: Multidetector CT imaging of the chest was performed using the standard protocol during bolus administration of intravenous contrast. Multiplanar CT image reconstructions and MIPs were obtained to evaluate the vascular anatomy. CONTRAST:  ISOVUE-370 IOPAMIDOL (ISOVUE-370) INJECTION 76% COMPARISON:  CTA chest dated September 18, 2006. FINDINGS: Cardiovascular: Satisfactory opacification of the pulmonary arteries to the segmental  level. No evidence of pulmonary embolism. Normal heart size. No pericardial effusion. Normal caliber thoracic aorta. Coronary artery atherosclerosis. Mediastinum/Nodes: No enlarged mediastinal, hilar, or axillary lymph nodes. Subcentimeter right hilar lymph nodes are likely reactive. Thyroid gland, trachea, and esophagus demonstrate no significant findings. Lungs/Pleura: Bibasilar atelectasis. No focal consolidation, pleural effusion, or pneumothorax. No suspicious pulmonary nodule. Upper Abdomen: No acute abnormality. Musculoskeletal: No chest wall abnormality. No acute or significant osseous findings. Review of the MIP images confirms the above findings. IMPRESSION: 1. No evidence of pulmonary embolism. No acute intrathoracic process. Electronically Signed   By: Obie Dredge M.D.   On: 11/07/2017 14:42    EKG: Independently reviewed. It shows normal sinus rhythm with a rate in the 60s. Left anterior vascular block with evidence of anteroseptal infarct which is old  Assessment/Plan Principal Problem:   Chest pain Active Problems:   GERD   History of pulmonary embolism   CAD (coronary artery disease)   #1 chest pain: Patient's symptoms and not typical cardiac but again he had remote history of cardiac disease. He'll be admitted for MI ruled out. Check serial enzymes 3. Monitor his response. If positive proceed with cardiac consultation. Recommend cardiac stress testing as an outpatient if negative.  #2 GERD: Continue with PPIs.  #3 hypertension: Blood pressure is controlled so continue his home regimen.  #4 history of PE: Patient has been on Lovenox since the 90s. He is not sure why he needed to be on the Lovenox. He had one time DVT after injuries to his legs. He has never been offered newer anticoagulants. Discussed with patient the need for him to discuss this with his physicians so he could be switched from Lovenox shots may be Xarelto or Eliquis    DVT prophylaxis: Lovenox   Code  Status: Full  Family Communication: Wife  Disposition Plan: Home   Consults called: None  Admission status: Observation   Severity of Illness: The appropriate patient status for this patient is OBSERVATION. Observation status is judged to be reasonable and necessary in order to provide the required intensity of service to ensure the patient's safety. The patient's presenting symptoms, physical exam findings, and initial radiographic and laboratory data in the context of their medical condition is felt to place them at decreased risk for further clinical deterioration. Furthermore, it is anticipated that the patient will be medically stable for discharge from  the hospital within 2 midnights of admission. The following factors support the patient status of observation.   " The patient's presenting symptoms include chest pain. " The physical exam findings include no cardiac findings. " The initial radiographic and laboratory data are known history of coronary artery disease.     Lonia Blood MD [facility] Pager [phone]   If 7PM-7AM, please contact night-coverage [email or web address] Password [facility]  11/07/2017, 4:04 PM

## 2017-11-07 NOTE — ED Notes (Signed)
Meal tray delivered.

## 2017-11-07 NOTE — ED Triage Notes (Signed)
Pt arrives to ED from home via EMS with complaints of dizziness  since waking up this morning and had a GERD feeling in his throat. EMS reports pt also experienced a hot flash and got slightly short of breath, which lasted 5-10 mins and then pt returned to baseline. PT has hx of MI x2, PE, DVT (2 currently and taking lovenox). Pt placed in position of comfort with bed locked and lowered, call bell in reach.

## 2017-11-08 ENCOUNTER — Observation Stay (HOSPITAL_COMMUNITY): Payer: Non-veteran care

## 2017-11-08 DIAGNOSIS — K210 Gastro-esophageal reflux disease with esophagitis: Secondary | ICD-10-CM

## 2017-11-08 DIAGNOSIS — Z86711 Personal history of pulmonary embolism: Secondary | ICD-10-CM

## 2017-11-08 DIAGNOSIS — R079 Chest pain, unspecified: Secondary | ICD-10-CM

## 2017-11-08 LAB — ECHOCARDIOGRAM COMPLETE
Height: 73 in
WEIGHTICAEL: 4416.25 [oz_av]

## 2017-11-08 LAB — GLUCOSE, CAPILLARY
GLUCOSE-CAPILLARY: 112 mg/dL — AB (ref 65–99)
GLUCOSE-CAPILLARY: 148 mg/dL — AB (ref 65–99)
Glucose-Capillary: 94 mg/dL (ref 65–99)
Glucose-Capillary: 125 mg/dL — ABNORMAL HIGH (ref 65–99)

## 2017-11-08 LAB — HIV ANTIBODY (ROUTINE TESTING W REFLEX): HIV Screen 4th Generation wRfx: NONREACTIVE

## 2017-11-08 MED ORDER — CARVEDILOL 3.125 MG PO TABS
3.1250 mg | ORAL_TABLET | Freq: Two times a day (BID) | ORAL | Status: DC
  Administered 2017-11-08 – 2017-11-10 (×4): 3.125 mg via ORAL
  Filled 2017-11-08 (×4): qty 1

## 2017-11-08 MED ORDER — ENOXAPARIN SODIUM 40 MG/0.4ML ~~LOC~~ SOLN
40.0000 mg | SUBCUTANEOUS | Status: DC
  Filled 2017-11-08: qty 0.4

## 2017-11-08 MED ORDER — LOSARTAN POTASSIUM 25 MG PO TABS
25.0000 mg | ORAL_TABLET | Freq: Every day | ORAL | Status: DC
  Administered 2017-11-08 – 2017-11-09 (×2): 25 mg via ORAL
  Filled 2017-11-08 (×2): qty 1

## 2017-11-08 MED ORDER — CLOPIDOGREL BISULFATE 75 MG PO TABS
75.0000 mg | ORAL_TABLET | Freq: Every day | ORAL | Status: DC
  Administered 2017-11-09 – 2017-11-10 (×2): 75 mg via ORAL
  Filled 2017-11-08 (×2): qty 1

## 2017-11-08 MED ORDER — HYDROCODONE-ACETAMINOPHEN 5-325 MG PO TABS: 1.0000 | ORAL_TABLET | Freq: Four times a day (QID) | ORAL | 0 refills | Status: DC | PRN

## 2017-11-08 MED ORDER — PANTOPRAZOLE SODIUM 40 MG PO TBEC: 40.0000 mg | DELAYED_RELEASE_TABLET | Freq: Two times a day (BID) | ORAL | 0 refills | Status: DC

## 2017-11-08 MED ORDER — TECHNETIUM TC 99M TETROFOSMIN IV KIT
30.0000 | PACK | Freq: Once | INTRAVENOUS | Status: AC | PRN
  Administered 2017-11-08: 30 via INTRAVENOUS

## 2017-11-08 MED ORDER — REGADENOSON 0.4 MG/5ML IV SOLN
0.4000 mg | Freq: Once | INTRAVENOUS | Status: AC
  Administered 2017-11-08: 0.4 mg via INTRAVENOUS
  Filled 2017-11-08: qty 5

## 2017-11-08 MED ORDER — TECHNETIUM TC 99M TETROFOSMIN IV KIT
10.0000 | PACK | Freq: Once | INTRAVENOUS | Status: AC | PRN
  Administered 2017-11-08: 10 via INTRAVENOUS

## 2017-11-08 MED ORDER — PANTOPRAZOLE SODIUM 40 MG PO TBEC: 40.0000 mg | DELAYED_RELEASE_TABLET | Freq: Two times a day (BID) | ORAL | 1 refills | Status: AC

## 2017-11-08 MED ORDER — CLOPIDOGREL BISULFATE 75 MG PO TABS
300.0000 mg | ORAL_TABLET | Freq: Once | ORAL | Status: AC
  Administered 2017-11-08: 300 mg via ORAL
  Filled 2017-11-08: qty 4

## 2017-11-08 MED ORDER — PANTOPRAZOLE SODIUM 40 MG PO TBEC
40.0000 mg | DELAYED_RELEASE_TABLET | Freq: Two times a day (BID) | ORAL | Status: DC
  Administered 2017-11-08 – 2017-11-10 (×5): 40 mg via ORAL
  Filled 2017-11-08 (×5): qty 1

## 2017-11-08 MED ORDER — REGADENOSON 0.4 MG/5ML IV SOLN
INTRAVENOUS | Status: AC
  Administered 2017-11-08: 0.4 mg via INTRAVENOUS
  Filled 2017-11-08: qty 5

## 2017-11-08 MED ORDER — ENOXAPARIN SODIUM 40 MG/0.4ML ~~LOC~~ SOLN: 40.0000 mg | SUBCUTANEOUS | Status: DC

## 2017-11-08 NOTE — Consult Note (Signed)
Reason for Consult: Chest pain Referring Physician: [facility]  Tim Garcia is an 57 y.o. male.  HPI: Patient is 57 year old male with past medical history significant for coronary artery disease history of anteroseptal wall myocardial infarction in 2009 status post PCI to LAD and RCA in the past, ischemic cardiomyopathy, hypertension, diabetes mellitus, history of bilateral pulmonary embolism in the past, history of DVT in the past, hyperlipidemia, morbid obesity, depression, history of EtOH and tobacco abuse in the past, history of carcinoma of prostate, was admitted yesterday because of retrosternal burning chest pain radiating to the throat associated with diaphoresis and shortness of breath and dizziness which lasted few minutes. Patient also gives history of exertional chest pressure while walking on the treadmill states had a stress test at [place] approximately 6 months ago which was okay old records not available. Patient denies any chest pain at present denies palpitation lightheadedness or syncope. Denies PND orthopnea leg swelling. MI was ruled out by serial enzymes and EKG.2  Past Medical History:  Diagnosis Date  . Hypertension   . MI (myocardial infarction) [facility])     Past Surgical History:  Procedure Laterality Date  . ANGIOPLASTY    . JOINT REPLACEMENT      History reviewed. No pertinent family history.  Social History:  reports that he has quit smoking. His smoking use included cigarettes. He smoked 0.50 packs per day. He does not have any smokeless tobacco history on file. He reports that he does not drink alcohol. His drug history is not on file.  Allergies:  Allergies  Allergen Reactions  . Hydrocodone Itching    Takes hydroxyzine to stop reaction  . Lisinopril Cough    Medications: I have reviewed the patient's current medications.  Results for orders placed or performed during the hospital encounter of 11/07/17 (from the past 48 hour(s))  Basic metabolic  panel     Status: Abnormal   Collection Time: 11/07/17  1:21 PM  Result Value Ref Range   Sodium 140 135 - 145 mmol/L   Potassium 4.2 3.5 - 5.1 mmol/L   Chloride 107 101 - 111 mmol/L   CO2 25 22 - 32 mmol/L   Glucose, Bld 130 (H) 65 - 99 mg/dL   BUN 5 (L) 6 - 20 mg/dL   Creatinine, Ser 0.91 0.61 - 1.24 mg/dL   Calcium 8.7 (L) 8.9 - 10.3 mg/dL   GFR calc non Af Amer >60 >60 mL/min   GFR calc Af Amer >60 >60 mL/min    Comment: (NOTE) The eGFR has been calculated using the CKD EPI equation. This calculation has not been validated in all clinical situations. eGFR's persistently <60 mL/min signify possible Chronic Kidney Disease.    Anion gap 8 5 - 15    Comment: Performed at [facility] [address] [place], [place] [address]  CBC with Differential     Status: Abnormal   Collection Time: 11/07/17  1:21 PM  Result Value Ref Range   WBC 4.4 4.0 - 10.5 K/uL   RBC 4.39 4.22 - 5.81 MIL/uL   Hemoglobin 11.9 (L) 13.0 - 17.0 g/dL   HCT 39.7 39.0 - 52.0 %   MCV 90.4 78.0 - 100.0 fL   MCH 27.1 26.0 - 34.0 pg   MCHC 30.0 30.0 - 36.0 g/dL   RDW 14.4 11.5 - 15.5 %   Platelets 215 150 - 400 K/uL   Neutrophils Relative % 57 %   Neutro Abs 2.5 1.7 - 7.7 K/uL  Lymphocytes Relative 33 %   Lymphs Abs 1.4 0.7 - 4.0 K/uL   Monocytes Relative 7 %   Monocytes Absolute 0.3 0.1 - 1.0 K/uL   Eosinophils Relative 3 %   Eosinophils Absolute 0.2 0.0 - 0.7 K/uL   Basophils Relative 0 %   Basophils Absolute 0.0 0.0 - 0.1 K/uL    Comment: Performed at [facility], [place] [address], [place], [place] [address]  I-stat troponin, ED     Status: None   Collection Time: 11/07/17  1:33 PM  Result Value Ref Range   Troponin i, poc 0.00 0.00 - 0.08 ng/mL   Comment 3            Comment: Due to the release kinetics of cTnI, a negative result within the first hours of the onset of symptoms does not rule out myocardial infarction with certainty. If myocardial infarction is still  suspected, repeat the test at appropriate intervals.   I-Stat Troponin, ED (not at [facility])     Status: None   Collection Time: 11/07/17  5:34 PM  Result Value Ref Range   Troponin i, poc 0.00 0.00 - 0.08 ng/mL   Comment 3            Comment: Due to the release kinetics of cTnI, a negative result within the first hours of the onset of symptoms does not rule out myocardial infarction with certainty. If myocardial infarction is still suspected, repeat the test at appropriate intervals.   HIV antibody (Routine Testing)     Status: None   Collection Time: 11/07/17  7:32 PM  Result Value Ref Range   HIV Screen 4th Generation wRfx Non Reactive Non Reactive    Comment: (NOTE) Performed At: [facility] [place], [place] [id number] Rush Farmer MD [id number] Performed at [facility], [place] [address] [place], [place] [address]   Troponin I-serum (0, 3, 6 hours)     Status: None   Collection Time: 11/07/17  7:32 PM  Result Value Ref Range   Troponin I <0.03 <0.03 ng/mL    Comment: Performed at [facility] [address], [place], [place] [address]  Troponin I-serum (0, 3, 6 hours)     Status: None   Collection Time: 11/07/17  9:40 PM  Result Value Ref Range   Troponin I <0.03 <0.03 ng/mL    Comment: Performed at [facility] [address] [place], [place] [address]  Glucose, capillary     Status: Abnormal   Collection Time: 11/07/17  9:47 PM  Result Value Ref Range   Glucose-Capillary 146 (H) 65 - 99 mg/dL  Glucose, capillary     Status: None   Collection Time: 11/08/17  7:32 AM  Result Value Ref Range   Glucose-Capillary 94 65 - 99 mg/dL    Ct Angio Chest Pe W And/or Wo Contrast  Result Date: 11/07/2017 CLINICAL DATA:  Dizziness. Shortness of breath and chest discomfort. EXAM: CT ANGIOGRAPHY CHEST WITH CONTRAST TECHNIQUE: Multidetector CT imaging of the chest was performed using the standard protocol during bolus administration of  intravenous contrast. Multiplanar CT image reconstructions and MIPs were obtained to evaluate the vascular anatomy. CONTRAST:  147m ISOVUE-370 IOPAMIDOL (ISOVUE-370) INJECTION 76% COMPARISON:  CTA chest dated September 18, 2006. FINDINGS: Cardiovascular: Satisfactory opacification of the pulmonary arteries to the segmental level. No evidence of pulmonary embolism. Normal heart size. No pericardial effusion. Normal caliber thoracic aorta. Coronary artery atherosclerosis. Mediastinum/Nodes: No enlarged  mediastinal, hilar, or axillary lymph nodes. Subcentimeter right hilar lymph nodes are likely reactive. Thyroid gland, trachea, and esophagus demonstrate no significant findings. Lungs/Pleura: Bibasilar atelectasis. No focal consolidation, pleural effusion, or pneumothorax. No suspicious pulmonary nodule. Upper Abdomen: No acute abnormality. Musculoskeletal: No chest wall abnormality. No acute or significant osseous findings. Review of the MIP images confirms the above findings. IMPRESSION: 1. No evidence of pulmonary embolism. No acute intrathoracic process. Electronically Signed   By: Titus Dubin M.D.   On: 11/07/2017 14:42    Review of Systems  Constitutional: Negative for chills and fever.  Eyes: Negative for blurred vision.  Respiratory: Positive for shortness of breath.   Cardiovascular: Positive for chest pain. Negative for palpitations and orthopnea.  Gastrointestinal: Negative for abdominal pain, nausea and vomiting.  Genitourinary: Negative for dysuria.  Skin: Negative for rash.  Neurological: Positive for dizziness. Negative for speech change and focal weakness.   Blood pressure 100/69, pulse 66, temperature 98 F (36.7 C), temperature source Oral, resp. rate 18, height 6' 1"  (1.854 m), weight 125.2 kg (276 lb 0.3 oz), SpO2 97 %. Physical Exam  Constitutional: He is oriented to person, place, and time.  HENT:  Head: Normocephalic and atraumatic.  Eyes: Conjunctivae are normal. Pupils  are equal, round, and reactive to light. Left eye exhibits no discharge. No scleral icterus.  Neck: Normal range of motion. Neck supple. No JVD present. No tracheal deviation present. No thyromegaly present.  Cardiovascular: Normal rate and regular rhythm.  Murmur (Soft systolic murmur noted no S3 gallop) heard. Respiratory: Effort normal and breath sounds normal. No respiratory distress. He has no rales.  GI: Soft. Bowel sounds are normal. He exhibits distension. There is no tenderness. There is no rebound.  Musculoskeletal: He exhibits no edema, tenderness or deformity.  Neurological: He is alert and oriented to person, place, and time.    Assessment/Plan: New-onset angina MI ruled out rule out progression of CAD Coronary artery disease history of anteroseptal wall MI in the past status post PCI to LAD and RCA Ischemic cardiomyopathy Hypertension Diabetes mellitus Hyperlipidemia History of bilateral PE in the past History of DVT in the past Morbid obesity Depression Her history of carcinoma of prostate GERD History of EtOH and tobacco abuse in the past Plan Continue present management Start low-dose ARB in view of diabetes and depressed LV systolic function in the past Scheduled for nuclear stress test today Check 2-D echo as Charolette Forward 11/08/2017, 9:20 AM

## 2017-11-08 NOTE — Progress Notes (Signed)
  Echocardiogram 2D Echocardiogram has been performed.  Tim Garcia, Tim Garcia 11/08/2017, 11:44 AM

## 2017-11-08 NOTE — CV Procedure (Signed)
2D echo attempted but patient having stress test

## 2017-11-08 NOTE — Progress Notes (Signed)
[facility]                                                                              Patient Demographics  Tim Garcia, is a 57 y.o. male, DOB - 16-Aug-1961, WUJ:[id number]  Admit date - 11/07/2017   Admitting Physician Rometta Emery, MD  Outpatient Primary MD for the patient is Patient, No Pcp Per  Outpatient specialists:   LOS - 0  days   Medical records reviewed and are as summarized below:    Chief Complaint  Patient presents with  . 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 Patient also reports burping and worsening acid reflux. He has a GI appointment at [facility] this month. Patient was admitted for chest pain rule out.   Assessment & Plan    Principal Problem:   Chest pain in the setting of CAD, MI in 2009 with PCI - Symptoms somewhat atypical however he does have a strong history of CAD. - Troponins 3 negative, no acute ST-T wave changes suggestive of ischemia. - Cardiology consulted, Dr. Sharyn Lull, recommended nuclear medicine stress test -  stress test showed EF of 37% with considerable scarring in the left ventricle, small region of inducible ischemia in the anterior wall and equally vocal region of inducible ischemia in the inferior wall, global poor wall thickening with hypokinesis in the apex and lateral wall, high risk stress test - Follow 2-D echo Continue aspirin, beta blocker, statin, will benefit from ACEI or ARB   Active Problems:   GERD - Per patient she he's been having worsening GERD over the last month with burping, epigastric pain, has a GI appointment at the [facility] - Increase Protonix to twice a day    History of DVT -Continue Lovenox   Hypertension  - Fairly controlled   Code Status: full DVT Prophylaxis:  Lovenox Family Communication:  Discussed in detail with the patient, all imaging results, lab results explained to the patient    Disposition Plan:   Time Spent in minutes  25 minutes  Procedures:  NM stress test Consultants:   Cardiology  Antimicrobials :      Medications  Scheduled Meds: . acetaminophen  650 mg Oral BID  . aspirin EC  81 mg Oral QHS  . atenolol  25 mg Oral QHS  . atorvastatin  40 mg Oral q1800  . enoxaparin  130 mg Subcutaneous Q12H  . pantoprazole  40 mg Oral BID AC  . sertraline  100 mg Oral QHS  . cyanocobalamin  1,000 mcg Oral QHS   Continuous Infusions: PRN Meds:.acetaminophen, HYDROcodone-acetaminophen, hydrOXYzine, ondansetron (ZOFRAN) IV   Antibiotics   Anti-infectives (From admission, onward)   None        Subjective:   Tim Garcia was seen and examined today.  Currently no chest pain, still has some burping. Patient denies dizziness, chest pain, shortness of breath, N/V/D/C, new weakness, numbess, tingling. No acute events overnight.  Objective:   Vitals:   11/08/17 0937 11/08/17 1034 11/08/17 1035 11/08/17 1144  BP: 117/77 107/71 107/71 116/83  Pulse: 76 65  69  Resp:  17    Temp:  97.9 F (36.6 C)  97.6 F (36.4 C)  TempSrc:  Oral  Oral  SpO2:  96% 92% 94%  Weight:      Height:        Intake/Output Summary (Last 24 hours) at 11/08/2017 1254 Last data filed at 11/08/2017 0900 Gross per 24 hour  Intake 1616.67 ml  Output -  Net 1616.67 ml     Wt Readings from Last 3 Encounters:  11/08/17 125.2 kg (276 lb 0.3 oz)     Exam  General: Alert and oriented x 3, NAD  Eyes: ,  HEENT:  Atraumatic, normocephalic  Cardiovascular: S1 S2 auscultated, no rubs, murmurs or gallops. Regular rate and rhythm.  Respiratory: Clear to auscultation bilaterally, no wheezing, rales or rhonchi  Gastrointestinal: Soft, nontender, nondistended, + bowel sounds  Ext: no pedal edema bilaterally  Neuro: no FND  Musculoskeletal: No digital cyanosis,  clubbing  Skin: No rashes  Psych: Normal affect and demeanor, alert and oriented x3    Data Reviewed:  I have personally reviewed following labs and imaging studies  Micro Results No results found for this or any previous visit (from the past 240 hour(s)).  Radiology Reports Ct Angio Chest Pe W And/or Wo Contrast  Result Date: 11/07/2017 CLINICAL DATA:  Dizziness. Shortness of breath and chest discomfort. EXAM: CT ANGIOGRAPHY CHEST WITH CONTRAST TECHNIQUE: Multidetector CT imaging of the chest was performed using the standard protocol during bolus administration of intravenous contrast. Multiplanar CT image reconstructions and MIPs were obtained to evaluate the vascular anatomy. CONTRAST:  100mL ISOVUE-370 IOPAMIDOL (ISOVUE-370) INJECTION 76% COMPARISON:  CTA chest dated September 18, 2006. FINDINGS: Cardiovascular: Satisfactory opacification of the pulmonary arteries to the segmental level. No evidence of pulmonary embolism. Normal heart size. No pericardial effusion. Normal caliber thoracic aorta. Coronary artery atherosclerosis. Mediastinum/Nodes: No enlarged mediastinal, hilar, or axillary lymph nodes. Subcentimeter right hilar lymph nodes are likely reactive. Thyroid gland, trachea, and esophagus demonstrate no significant findings. Lungs/Pleura: Bibasilar atelectasis. No focal consolidation, pleural effusion, or pneumothorax. No suspicious pulmonary nodule. Upper Abdomen: No acute abnormality. Musculoskeletal: No chest wall abnormality. No acute or significant osseous findings. Review of the MIP images confirms the above findings. IMPRESSION: 1. No evidence of pulmonary embolism. No acute intrathoracic process. Electronically Signed   By: Obie DredgeWilliam T Derry M.D.   On: 11/07/2017 14:42   Nm Myocar Multi W/spect W/wall Motion / Ef  Result Date: 11/08/2017 CLINICAL DATA:  Chest pain. Prior myocardial infarction. Hypertension and coronary artery disease. EXAM: MYOCARDIAL IMAGING WITH SPECT (REST AND  PHARMACOLOGIC-STRESS) GATED LEFT VENTRICULAR WALL MOTION STUDY LEFT VENTRICULAR EJECTION FRACTION TECHNIQUE: Standard myocardial SPECT imaging was performed after resting intravenous injection of 10 mCi Tc-97m tetrofosmin. Subsequently, intravenous infusion of Lexiscan was performed under the supervision of the Cardiology staff. At peak effect of the drug, 30 mCi Tc-107m tetrofosmin was injected intravenously and standard myocardial SPECT imaging was performed. Quantitative gated imaging was also performed to evaluate left ventricular wall motion, and estimate left ventricular ejection fraction. COMPARISON:  Report from 10/26/2012 FINDINGS: Perfusion: Large matched perfusion defects in the cardiac apex, anterior wall, inferior wall, and septum. Small region of inducible ischemia in the anterior wall mid heart with 15% reduction in activity on stress images compared to rest images. Borderline inducible ischemia along the inferior wall in  a small region, with 10% reduction in activity-perhaps not reliable given the significant degree of diaphragmatic attenuation observed on the raw images. Wall Motion: Global poor wall thickening. Hypokinesis in the cardiac apex and lateral wall. Left Ventricular Ejection Fraction: 37 % End diastolic volume 178 ml (mildly high) End systolic volume 112 ml (prominently high) IMPRESSION: 1. Considerable scarring in the left ventricle as detailed above, with a small region of inducible ischemia in the anterior wall and an equivocal region of inducible ischemia in the inferior wall. 2. Global poor wall thickening with hypokinesis in the cardiac apex and lateral wall. 3. Left ventricular ejection fraction 37% 4. Non invasive risk stratification*: High *2012 Appropriate Use Criteria for Coronary Revascularization Focused Update: J Am Coll Cardiol. 2012;59(9):857-881. http://content.dementiazones.com.aspx?articleid=1201161 Electronically Signed   By: Gaylyn Rong M.D.   On:  11/08/2017 12:40    Lab Data:  CBC: Recent Labs  Lab 11/07/17 1321  WBC 4.4  NEUTROABS 2.5  HGB 11.9*  HCT 39.7  MCV 90.4  PLT 215   Basic Metabolic Panel: Recent Labs  Lab 11/07/17 1321  NA 140  K 4.2  CL 107  CO2 25  GLUCOSE 130*  BUN 5*  CREATININE 0.91  CALCIUM 8.7*   GFR: Estimated Creatinine Clearance: 121.2 mL/min (by C-G formula based on SCr of 0.91 mg/dL). Liver Function Tests: No results for input(s): AST, ALT, ALKPHOS, BILITOT, PROT, ALBUMIN in the last 168 hours. No results for input(s): LIPASE, AMYLASE in the last 168 hours. No results for input(s): AMMONIA in the last 168 hours. Coagulation Profile: No results for input(s): INR, PROTIME in the last 168 hours. Cardiac Enzymes: Recent Labs  Lab 11/07/17 1932 11/07/17 2140  TROPONINI <0.03 <0.03   BNP (last 3 results) No results for input(s): PROBNP in the last 8760 hours. HbA1C: No results for input(s): HGBA1C in the last 72 hours. CBG: Recent Labs  Lab 11/07/17 2147 11/08/17 0732  GLUCAP 146* 94   Lipid Profile: No results for input(s): CHOL, HDL, LDLCALC, TRIG, CHOLHDL, LDLDIRECT in the last 72 hours. Thyroid Function Tests: No results for input(s): TSH, T4TOTAL, FREET4, T3FREE, THYROIDAB in the last 72 hours. Anemia Panel: No results for input(s): VITAMINB12, FOLATE, FERRITIN, TIBC, IRON, RETICCTPCT in the last 72 hours. Urine analysis:    Component Value Date/Time   COLORURINE YELLOW 04/15/2007 0953   APPEARANCEUR CLEAR 04/15/2007 0953   LABSPEC 1.030 04/15/2007 0953   PHURINE 5.5 04/15/2007 0953   GLUCOSEU NEGATIVE 04/15/2007 0953   HGBUR NEGATIVE 04/15/2007 0953   BILIRUBINUR NEGATIVE 04/15/2007 0953   KETONESUR 15 (A) 04/15/2007 0953   PROTEINUR NEGATIVE 04/15/2007 0953   UROBILINOGEN 0.2 04/15/2007 0953   NITRITE NEGATIVE 04/15/2007 0953   LEUKOCYTESUR  04/15/2007 0953    NEGATIVE MICROSCOPIC NOT DONE ON URINES WITH NEGATIVE PROTEIN, BLOOD, LEUKOCYTES, NITRITE, OR GLUCOSE  <1000 mg/dL.     Thad Ranger M.D. [facility] 11/08/2017, 12:54 PM  Pager: [phone] Between 7am to 7pm - call Pager - [phone]  After 7pm go to [email or web address] - password [email or web address]  Call night coverage person covering after 7pm

## 2017-11-08 NOTE — Plan of Care (Signed)
  Progressing Clinical Measurements: Ability to maintain clinical measurements within normal limits will improve 11/08/2017 0255 - Progressing by Horris Latinouvall, Charlene Detter G, RN Note VSS.  Labs and test results WNL. Will remain free from infection 11/08/2017 0255 - Progressing by Horris Latinouvall, Arnita Koons G, RN Note No s/s of infection noted. Pain Managment: General experience of comfort will improve 11/08/2017 0255 - Progressing by Horris Latinouvall, Kylian Loh G, RN Note Pt c/o mild H/A pain controlled by Tylenol.

## 2017-11-09 ENCOUNTER — Encounter (HOSPITAL_COMMUNITY): Payer: Self-pay

## 2017-11-09 DIAGNOSIS — Z91040 Latex allergy status: Secondary | ICD-10-CM | POA: Diagnosis not present

## 2017-11-09 DIAGNOSIS — F419 Anxiety disorder, unspecified: Secondary | ICD-10-CM | POA: Diagnosis present

## 2017-11-09 DIAGNOSIS — K219 Gastro-esophageal reflux disease without esophagitis: Secondary | ICD-10-CM | POA: Diagnosis present

## 2017-11-09 DIAGNOSIS — M549 Dorsalgia, unspecified: Secondary | ICD-10-CM

## 2017-11-09 DIAGNOSIS — Z6835 Body mass index (BMI) 35.0-35.9, adult: Secondary | ICD-10-CM | POA: Diagnosis not present

## 2017-11-09 DIAGNOSIS — G8929 Other chronic pain: Secondary | ICD-10-CM

## 2017-11-09 DIAGNOSIS — I25110 Atherosclerotic heart disease of native coronary artery with unstable angina pectoris: Secondary | ICD-10-CM

## 2017-11-09 DIAGNOSIS — I509 Heart failure, unspecified: Secondary | ICD-10-CM | POA: Diagnosis present

## 2017-11-09 DIAGNOSIS — Z888 Allergy status to other drugs, medicaments and biological substances status: Secondary | ICD-10-CM | POA: Diagnosis not present

## 2017-11-09 DIAGNOSIS — Z8546 Personal history of malignant neoplasm of prostate: Secondary | ICD-10-CM | POA: Diagnosis not present

## 2017-11-09 DIAGNOSIS — Z87891 Personal history of nicotine dependence: Secondary | ICD-10-CM | POA: Diagnosis not present

## 2017-11-09 DIAGNOSIS — E119 Type 2 diabetes mellitus without complications: Secondary | ICD-10-CM | POA: Diagnosis present

## 2017-11-09 DIAGNOSIS — I444 Left anterior fascicular block: Secondary | ICD-10-CM | POA: Diagnosis present

## 2017-11-09 DIAGNOSIS — F329 Major depressive disorder, single episode, unspecified: Secondary | ICD-10-CM | POA: Diagnosis present

## 2017-11-09 DIAGNOSIS — Z86718 Personal history of other venous thrombosis and embolism: Secondary | ICD-10-CM | POA: Diagnosis not present

## 2017-11-09 DIAGNOSIS — E785 Hyperlipidemia, unspecified: Secondary | ICD-10-CM | POA: Diagnosis present

## 2017-11-09 DIAGNOSIS — I252 Old myocardial infarction: Secondary | ICD-10-CM | POA: Diagnosis not present

## 2017-11-09 DIAGNOSIS — Z79899 Other long term (current) drug therapy: Secondary | ICD-10-CM | POA: Diagnosis not present

## 2017-11-09 DIAGNOSIS — Z7982 Long term (current) use of aspirin: Secondary | ICD-10-CM | POA: Diagnosis not present

## 2017-11-09 DIAGNOSIS — I255 Ischemic cardiomyopathy: Secondary | ICD-10-CM | POA: Diagnosis present

## 2017-11-09 DIAGNOSIS — I25119 Atherosclerotic heart disease of native coronary artery with unspecified angina pectoris: Secondary | ICD-10-CM | POA: Diagnosis present

## 2017-11-09 DIAGNOSIS — Z885 Allergy status to narcotic agent status: Secondary | ICD-10-CM | POA: Diagnosis not present

## 2017-11-09 DIAGNOSIS — I110 Hypertensive heart disease with heart failure: Secondary | ICD-10-CM | POA: Diagnosis present

## 2017-11-09 DIAGNOSIS — Z86711 Personal history of pulmonary embolism: Secondary | ICD-10-CM | POA: Diagnosis not present

## 2017-11-09 LAB — CBC
HCT: 37.9 % — ABNORMAL LOW (ref 39.0–52.0)
HEMOGLOBIN: 11.5 g/dL — AB (ref 13.0–17.0)
MCH: 27.1 pg (ref 26.0–34.0)
MCHC: 30.3 g/dL (ref 30.0–36.0)
MCV: 89.4 fL (ref 78.0–100.0)
PLATELETS: 233 10*3/uL (ref 150–400)
RBC: 4.24 MIL/uL (ref 4.22–5.81)
RDW: 14.4 % (ref 11.5–15.5)
WBC: 4.7 10*3/uL (ref 4.0–10.5)

## 2017-11-09 LAB — BASIC METABOLIC PANEL
ANION GAP: 12 (ref 5–15)
BUN: 6 mg/dL (ref 6–20)
CALCIUM: 9 mg/dL (ref 8.9–10.3)
CO2: 24 mmol/L (ref 22–32)
CREATININE: 0.9 mg/dL (ref 0.61–1.24)
Chloride: 105 mmol/L (ref 101–111)
GFR calc Af Amer: >60 mL/min (ref >60)
GFR calc non Af Amer: >60 mL/min (ref >60)
Glucose, Bld: 88 mg/dL (ref 65–99)
Potassium: 4 mmol/L (ref 3.5–5.1)
Sodium: 141 mmol/L (ref 135–145)

## 2017-11-09 LAB — GLUCOSE, CAPILLARY
GLUCOSE-CAPILLARY: 79 mg/dL (ref 65–99)
GLUCOSE-CAPILLARY: 108 mg/dL — AB (ref 65–99)
Glucose-Capillary: 130 mg/dL — ABNORMAL HIGH (ref 65–99)

## 2017-11-09 MED ORDER — SODIUM CHLORIDE 0.9% FLUSH
3.0000 mL | Freq: Two times a day (BID) | INTRAVENOUS | Status: DC
  Administered 2017-11-09: 3 mL via INTRAVENOUS

## 2017-11-09 MED ORDER — SODIUM CHLORIDE 0.9 % IV SOLN: 250.0000 mL | INTRAVENOUS | Status: DC | PRN

## 2017-11-09 MED ORDER — SODIUM CHLORIDE 0.9% FLUSH: 3.0000 mL | INTRAVENOUS | Status: DC | PRN

## 2017-11-09 MED ORDER — SODIUM CHLORIDE 0.9 % WEIGHT BASED INFUSION
1.0000 mL/kg/h | INTRAVENOUS | Status: DC
  Administered 2017-11-10: 1 mL/kg/h via INTRAVENOUS

## 2017-11-09 NOTE — Progress Notes (Signed)
Subjective:  Patient denies any further chest pain.  Denies any shortness of breath.  Complains of occasional GERD symptoms.  Lexiscan Myoview showed small area of anterior wall ischemia and equivocal ischemia and inferior wall with moderately depressed LV systolic function, EF of 37%. Objective:  Vital Signs in the last 24 hours: Temp:  [97.6 F (36.4 C)-98.2 F (36.8 C)] 98.1 F (36.7 C) (02/20 0436) Pulse Rate:  [67-76] 76 (02/20 0928) BP: (107-129)/(68-94) 124/94 (02/20 0930) SpO2:  [94 %-96 %] 94 % (02/20 0436) Weight:  [124.1 kg (273 lb 9.6 oz)] 124.1 kg (273 lb 9.6 oz) (02/20 0436)  Intake/Output from previous day: 02/19 0701 - 02/20 0700 In: 580 [P.O.:580] Out: -  Intake/Output from this shift: Total I/O In: 120 [P.O.:120] Out: -   Physical Exam: Neck: no adenopathy, no carotid bruit, no JVD and supple, symmetrical, trachea midline Lungs: clear to auscultation bilaterally Heart: regular rate and rhythm, S1, S2 normal and soft systolic murmur noted Abdomen: soft, non-tender; bowel sounds normal; no masses,  no organomegaly Extremities: extremities normal, atraumatic, no cyanosis or edema  Lab Results: Recent Labs    11/07/17 1321 11/09/17 0500  WBC 4.4 4.7  HGB 11.9* 11.5*  PLT 215 233   Recent Labs    11/07/17 1321 11/09/17 0500  NA 140 141  K 4.2 4.0  CL 107 105  CO2 25 24  GLUCOSE 130* 88  BUN 5* 6  CREATININE 0.91 0.90   Recent Labs    11/07/17 1932 11/07/17 2140  TROPONINI <0.03 <0.03   Hepatic Function Panel No results for input(s): PROT, ALBUMIN, AST, ALT, ALKPHOS, BILITOT, BILIDIR, IBILI in the last 72 hours. No results for input(s): CHOL in the last 72 hours. No results for input(s): PROTIME in the last 72 hours.  Imaging: Imaging results have been reviewed and Ct Angio Chest Pe W And/or Wo Contrast  Result Date: 11/07/2017 CLINICAL DATA:  Dizziness. Shortness of breath and chest discomfort. EXAM: CT ANGIOGRAPHY CHEST WITH CONTRAST  TECHNIQUE: Multidetector CT imaging of the chest was performed using the standard protocol during bolus administration of intravenous contrast. Multiplanar CT image reconstructions and MIPs were obtained to evaluate the vascular anatomy. CONTRAST:  100mL ISOVUE-370 IOPAMIDOL (ISOVUE-370) INJECTION 76% COMPARISON:  CTA chest dated September 18, 2006. FINDINGS: Cardiovascular: Satisfactory opacification of the pulmonary arteries to the segmental level. No evidence of pulmonary embolism. Normal heart size. No pericardial effusion. Normal caliber thoracic aorta. Coronary artery atherosclerosis. Mediastinum/Nodes: No enlarged mediastinal, hilar, or axillary lymph nodes. Subcentimeter right hilar lymph nodes are likely reactive. Thyroid gland, trachea, and esophagus demonstrate no significant findings. Lungs/Pleura: Bibasilar atelectasis. No focal consolidation, pleural effusion, or pneumothorax. No suspicious pulmonary nodule. Upper Abdomen: No acute abnormality. Musculoskeletal: No chest wall abnormality. No acute or significant osseous findings. Review of the MIP images confirms the above findings. IMPRESSION: 1. No evidence of pulmonary embolism. No acute intrathoracic process. Electronically Signed   By: Obie DredgeWilliam T Derry M.D.   On: 11/07/2017 14:42   Nm Myocar Multi W/spect W/wall Motion / Ef  Result Date: 11/08/2017 CLINICAL DATA:  Chest pain. Prior myocardial infarction. Hypertension and coronary artery disease. EXAM: MYOCARDIAL IMAGING WITH SPECT (REST AND PHARMACOLOGIC-STRESS) GATED LEFT VENTRICULAR WALL MOTION STUDY LEFT VENTRICULAR EJECTION FRACTION TECHNIQUE: Standard myocardial SPECT imaging was performed after resting intravenous injection of 10 mCi Tc-5352m tetrofosmin. Subsequently, intravenous infusion of Lexiscan was performed under the supervision of the Cardiology staff. At peak effect of the drug, 30 mCi Tc-5452m tetrofosmin was injected intravenously  and standard myocardial SPECT imaging was performed.  Quantitative gated imaging was also performed to evaluate left ventricular wall motion, and estimate left ventricular ejection fraction. COMPARISON:  Report from 10/26/2012 FINDINGS: Perfusion: Large matched perfusion defects in the cardiac apex, anterior wall, inferior wall, and septum. Small region of inducible ischemia in the anterior wall mid heart with 15% reduction in activity on stress images compared to rest images. Borderline inducible ischemia along the inferior wall in a small region, with 10% reduction in activity-perhaps not reliable given the significant degree of diaphragmatic attenuation observed on the raw images. Wall Motion: Global poor wall thickening. Hypokinesis in the cardiac apex and lateral wall. Left Ventricular Ejection Fraction: 37 % End diastolic volume 178 ml (mildly high) End systolic volume 112 ml (prominently high) IMPRESSION: 1. Considerable scarring in the left ventricle as detailed above, with a small region of inducible ischemia in the anterior wall and an equivocal region of inducible ischemia in the inferior wall. 2. Global poor wall thickening with hypokinesis in the cardiac apex and lateral wall. 3. Left ventricular ejection fraction 37% 4. Non invasive risk stratification*: High *2012 Appropriate Use Criteria for Coronary Revascularization Focused Update: J Am Coll Cardiol. 2012;59(9):857-881. http://content.dementiazones.com.aspx?articleid=1201161 Electronically Signed   By: Gaylyn Rong M.D.   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  Lovenox dose has been reduced. Will add antianginal medications as blood pressure tolerates   LOS: 0 days    Rinaldo Cloud 11/09/2017, 11:24 AM

## 2017-11-09 NOTE — Progress Notes (Signed)
PROGRESS NOTE  Tim GilmoreLarry Garcia [id number] DOB: 07/14/1961 DOA: 11/07/2017 PCP: Patient, No Pcp Per  HPI/Recap of past 24 hours: Patient is a 57 year old male with history of CAD, hypertension, MI in 2009, chronically on Lovenox for DVT presented with chest pain, radiating down his left arm concerning for possible angina. Patient also reports burping and worsening acid reflux. [facility]. Patient was admitted for chest pain rule out.  11/09/17: seen and examined at his bedside. Denies chest pain, palpitations, or dyspnea. LHC planned tomorrow 11/10/17.  Assessment/Plan: Principal Problem:   Chest pain Active Problems:   GERD   History of pulmonary embolism   CAD (coronary artery disease)  Chest pain r/o ACS -troponin negative x3 -CAD -cardiology following -LHC planned tomorrow 11/10/17  CAD with previous MI -continue ASA, plavix, statin and beta blocker  Hx of PE -on full dose lovonox injection at home -procedure planned tomorrow -restart lovenox full dose post procedure  Depression/anxiety -continue zoloft, hydroxyzine  GERD -stable -continue prilosec  Chronic back pain -stable -continue meloxicam -continue tylenol prn  Morbid obesity -recommend weight loss when more stable -outpatient follow up   Code Status: full  Family Communication: none at bedside   Disposition Plan: to be determined   Consultants:  cardiology  Procedures:  Left heart cath planned tomorrow  Antimicrobials:  none  DVT prophylaxis:  sq lovenox   Objective: Vitals:   11/09/17 0436 11/09/17 0928 11/09/17 0930 11/09/17 1417  BP: 129/82 (!) 124/94 (!) 124/94 (!) 133/94  Pulse: 67 76  77  Resp:      Temp: 98.1 F (36.7 C)   97.9 F (36.6 C)  TempSrc: Oral   Axillary  SpO2: 94%   96%  Weight: 124.1 kg (273 lb 9.6 oz)     Height:        Intake/Output Summary (Last 24 hours) at 11/09/2017 1915 Last data filed at 11/09/2017 1754 Gross per  24 hour  Intake 700 ml  Output -  Net 700 ml   Filed Weights   11/07/17 2127 11/08/17 0456 11/09/17 0436  Weight: 125.2 kg (276 lb 1.6 oz) 125.2 kg (276 lb 0.3 oz) 124.1 kg (273 lb 9.6 oz)    Exam:   General:  57 yo M WD WN NAD A&O x 3  Cardiovascular: RRR no rubs or gallops   Respiratory: CTA no wheezes or rales  Abdomen: soft NT ND NBS x4  Musculoskeletal: no focal abnormalities  Skin: no rash  Psychiatry: mood appropriate for condition and setting   Data Reviewed: CBC: Recent Labs  Lab 11/07/17 1321 11/09/17 0500  WBC 4.4 4.7  NEUTROABS 2.5  --   HGB 11.9* 11.5*  HCT 39.7 37.9*  MCV 90.4 89.4  PLT 215 233   Basic Metabolic Panel: Recent Labs  Lab 11/07/17 1321 11/09/17 0500  NA 140 141  K 4.2 4.0  CL 107 105  CO2 25 24  GLUCOSE 130* 88  BUN 5* 6  CREATININE 0.91 0.90  CALCIUM 8.7* 9.0   GFR: Estimated Creatinine Clearance: 122 mL/min (by C-G formula based on SCr of 0.9 mg/dL). Liver Function Tests: No results for input(s): AST, ALT, ALKPHOS, BILITOT, PROT, ALBUMIN in the last 168 hours. No results for input(s): LIPASE, AMYLASE in the last 168 hours. No results for input(s): AMMONIA in the last 168 hours. Coagulation Profile: No results for input(s): INR, PROTIME in the last 168 hours. Cardiac Enzymes: Recent Labs  Lab 11/07/17 1932 11/07/17  2140  TROPONINI <0.03 <0.03   BNP (last 3 results) No results for input(s): PROBNP in the last 8760 hours. HbA1C: No results for input(s): HGBA1C in the last 72 hours. CBG: Recent Labs  Lab 11/08/17 1716 11/08/17 2055 11/09/17 0725 11/09/17 1127 11/09/17 1628  GLUCAP 148* 125* 79 108* 130*   Lipid Profile: No results for input(s): CHOL, HDL, LDLCALC, TRIG, CHOLHDL, LDLDIRECT in the last 72 hours. Thyroid Function Tests: No results for input(s): TSH, T4TOTAL, FREET4, T3FREE, THYROIDAB in the last 72 hours. Anemia Panel: No results for input(s): VITAMINB12, FOLATE, FERRITIN, TIBC, IRON,  RETICCTPCT in the last 72 hours. Urine analysis:    Component Value Date/Time   COLORURINE YELLOW 04/15/2007 0953   APPEARANCEUR CLEAR 04/15/2007 0953   LABSPEC 1.030 04/15/2007 0953   PHURINE 5.5 04/15/2007 0953   GLUCOSEU NEGATIVE 04/15/2007 0953   HGBUR NEGATIVE 04/15/2007 0953   BILIRUBINUR NEGATIVE 04/15/2007 0953   KETONESUR 15 (A) 04/15/2007 0953   PROTEINUR NEGATIVE 04/15/2007 0953   UROBILINOGEN 0.2 04/15/2007 0953   NITRITE NEGATIVE 04/15/2007 0953   LEUKOCYTESUR  04/15/2007 0953    NEGATIVE MICROSCOPIC NOT DONE ON URINES WITH NEGATIVE PROTEIN, BLOOD, LEUKOCYTES, NITRITE, OR GLUCOSE <1000 mg/dL.   Sepsis Labs: @LABRCNTIP (procalcitonin:4,lacticidven:4)  )No results found for this or any previous visit (from the past 240 hour(s)).    Studies: No results found.  Scheduled Meds: . acetaminophen  650 mg Oral BID  . aspirin EC  81 mg Oral QHS  . atorvastatin  40 mg Oral q1800  . carvedilol  3.125 mg Oral BID WC  . clopidogrel  75 mg Oral Daily  . enoxaparin (LOVENOX) injection  40 mg Subcutaneous Q24H  . losartan  25 mg Oral Daily  . pantoprazole  40 mg Oral BID AC  . sertraline  100 mg Oral QHS  . sodium chloride flush  3 mL Intravenous Q12H  . cyanocobalamin  1,000 mcg Oral QHS    Continuous Infusions: . sodium chloride    . sodium chloride       LOS: 0 days     Darlin Drop, MD [facility] Pager [phone]  If 7PM-7AM, please contact night-coverage [email or web address] Password [facility] 11/09/2017, 7:15 PM

## 2017-11-09 NOTE — Care Management Note (Addendum)
Case Management Note  Patient Details  Name: Tim Garcia MRN: [id number] Date of Birth: 01/03/1961  Subjective/Objective:   Pt presented for Chest Pain- s/p high risk stress test. Plan for cardiac cath 11-10-17. PTA from home with wife. Pt has a [facility] Benef[facility] and [facility]. [facility]CP[facility]. Pt is 100 % Service connected so [place] [facility] [place].                 Action/Plan: CM did call the [facility] to make them aware that pt is hospitalized. CM also called [facility] 11-09-17 in regards to medications post hospitalization: fax # for d/c summary and medications. CM was able to leave voicemail and awaiting call back. No home needs identified for patient at this time. Per pt if he needs to he can go to local pharmacy [facility] if he has new Rx's. No further needs at this time.   Expected Discharge Date:  11/10/17               Expected Discharge Plan:  Home/Self Care  In-House Referral:  NA  Discharge planning Services  CM Consult, Medication Assistance  Post Acute Care Choice:  NA Choice offered to:  NA  DME Arranged:  N/A DME Agency:  NA  HH Arranged:  NA HH Agency:  NA  Status of Service:  Completed, signed off  If discussed at Long Length of Stay Meetings, dates discussed:    Additional Comments: 1433 11-10-17 Tim BambergerBrenda Graves-Bigelow, RN,BSN [phone] CM will fax [facility] summary [facility]. CM will fax Rx's to clinic as well. [phone]. Tim Garcia, Tim Garcia [name], RN 11/09/2017, 10:19 AM

## 2017-11-09 NOTE — H&P (View-Only) (Signed)
Subjective:  Patient denies any further chest pain.  Denies any shortness of breath.  Complains of occasional GERD symptoms.  Lexiscan Myoview showed small area of anterior wall ischemia and equivocal ischemia and inferior wall with moderately depressed LV systolic function, EF of 37%. Objective:  Vital Signs in the last 24 hours: Temp:  [97.6 F (36.4 C)-98.2 F (36.8 C)] 98.1 F (36.7 C) (02/20 0436) Pulse Rate:  [67-76] 76 (02/20 0928) BP: (107-129)/(68-94) 124/94 (02/20 0930) SpO2:  [94 %-96 %] 94 % (02/20 0436) Weight:  [124.1 kg (273 lb 9.6 oz)] 124.1 kg (273 lb 9.6 oz) (02/20 0436)  Intake/Output from previous day: 02/19 0701 - 02/20 0700 In: 580 [P.O.:580] Out: -  Intake/Output from this shift: Total I/O In: 120 [P.O.:120] Out: -   Physical Exam: Neck: no adenopathy, no carotid bruit, no JVD and supple, symmetrical, trachea midline Lungs: clear to auscultation bilaterally Heart: regular rate and rhythm, S1, S2 normal and soft systolic murmur noted Abdomen: soft, non-tender; bowel sounds normal; no masses,  no organomegaly Extremities: extremities normal, atraumatic, no cyanosis or edema  Lab Results: Recent Labs    11/07/17 1321 11/09/17 0500  WBC 4.4 4.7  HGB 11.9* 11.5*  PLT 215 233   Recent Labs    11/07/17 1321 11/09/17 0500  NA 140 141  K 4.2 4.0  CL 107 105  CO2 25 24  GLUCOSE 130* 88  BUN 5* 6  CREATININE 0.91 0.90   Recent Labs    11/07/17 1932 11/07/17 2140  TROPONINI <0.03 <0.03   Hepatic Function Panel No results for input(s): PROT, ALBUMIN, AST, ALT, ALKPHOS, BILITOT, BILIDIR, IBILI in the last 72 hours. No results for input(s): CHOL in the last 72 hours. No results for input(s): PROTIME in the last 72 hours.  Imaging: Imaging results have been reviewed and Ct Angio Chest Pe W And/or Wo Contrast  Result Date: 11/07/2017 CLINICAL DATA:  Dizziness. Shortness of breath and chest discomfort. EXAM: CT ANGIOGRAPHY CHEST WITH CONTRAST  TECHNIQUE: Multidetector CT imaging of the chest was performed using the standard protocol during bolus administration of intravenous contrast. Multiplanar CT image reconstructions and MIPs were obtained to evaluate the vascular anatomy. CONTRAST:  100mL ISOVUE-370 IOPAMIDOL (ISOVUE-370) INJECTION 76% COMPARISON:  CTA chest dated September 18, 2006. FINDINGS: Cardiovascular: Satisfactory opacification of the pulmonary arteries to the segmental level. No evidence of pulmonary embolism. Normal heart size. No pericardial effusion. Normal caliber thoracic aorta. Coronary artery atherosclerosis. Mediastinum/Nodes: No enlarged mediastinal, hilar, or axillary lymph nodes. Subcentimeter right hilar lymph nodes are likely reactive. Thyroid gland, trachea, and esophagus demonstrate no significant findings. Lungs/Pleura: Bibasilar atelectasis. No focal consolidation, pleural effusion, or pneumothorax. No suspicious pulmonary nodule. Upper Abdomen: No acute abnormality. Musculoskeletal: No chest wall abnormality. No acute or significant osseous findings. Review of the MIP images confirms the above findings. IMPRESSION: 1. No evidence of pulmonary embolism. No acute intrathoracic process. Electronically Signed   By: Obie DredgeWilliam T Derry M.D.   On: 11/07/2017 14:42   Nm Myocar Multi W/spect W/wall Motion / Ef  Result Date: 11/08/2017 CLINICAL DATA:  Chest pain. Prior myocardial infarction. Hypertension and coronary artery disease. EXAM: MYOCARDIAL IMAGING WITH SPECT (REST AND PHARMACOLOGIC-STRESS) GATED LEFT VENTRICULAR WALL MOTION STUDY LEFT VENTRICULAR EJECTION FRACTION TECHNIQUE: Standard myocardial SPECT imaging was performed after resting intravenous injection of 10 mCi Tc-5352m tetrofosmin. Subsequently, intravenous infusion of Lexiscan was performed under the supervision of the Cardiology staff. At peak effect of the drug, 30 mCi Tc-5452m tetrofosmin was injected intravenously  and standard myocardial SPECT imaging was performed.  Quantitative gated imaging was also performed to evaluate left ventricular wall motion, and estimate left ventricular ejection fraction. COMPARISON:  Report from 10/26/2012 FINDINGS: Perfusion: Large matched perfusion defects in the cardiac apex, anterior wall, inferior wall, and septum. Small region of inducible ischemia in the anterior wall mid heart with 15% reduction in activity on stress images compared to rest images. Borderline inducible ischemia along the inferior wall in a small region, with 10% reduction in activity-perhaps not reliable given the significant degree of diaphragmatic attenuation observed on the raw images. Wall Motion: Global poor wall thickening. Hypokinesis in the cardiac apex and lateral wall. Left Ventricular Ejection Fraction: 37 % End diastolic volume 178 ml (mildly high) End systolic volume 112 ml (prominently high) IMPRESSION: 1. Considerable scarring in the left ventricle as detailed above, with a small region of inducible ischemia in the anterior wall and an equivocal region of inducible ischemia in the inferior wall. 2. Global poor wall thickening with hypokinesis in the cardiac apex and lateral wall. 3. Left ventricular ejection fraction 37% 4. Non invasive risk stratification*: High *2012 Appropriate Use Criteria for Coronary Revascularization Focused Update: J Am Coll Cardiol. 2012;59(9):857-881. http://content.dementiazones.com.aspx?articleid=[email or web address] Electronically Signed   By: Gaylyn Rong M.D.   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  Lovenox dose has been reduced. Will add antianginal medications as blood pressure tolerates   LOS: 0 days    Rinaldo Cloud 11/09/2017, 11:24 AM

## 2017-11-10 ENCOUNTER — Inpatient Hospital Stay (HOSPITAL_COMMUNITY)
Admission: EM | Disposition: A | Discharge status: Home / Self Care | Payer: Self-pay | Source: Home / Self Care | Attending: Internal Medicine

## 2017-11-10 ENCOUNTER — Encounter (HOSPITAL_COMMUNITY): Payer: Self-pay | Admitting: Cardiology

## 2017-11-10 DIAGNOSIS — I5022 Chronic systolic (congestive) heart failure: Secondary | ICD-10-CM

## 2017-11-10 HISTORY — PX: LEFT HEART CATH AND CORONARY ANGIOGRAPHY: CATH118249

## 2017-11-10 LAB — GLUCOSE, CAPILLARY
Glucose-Capillary: 94 mg/dL (ref 65–99)
Glucose-Capillary: 96 mg/dL (ref 65–99)
Glucose-Capillary: 158 mg/dL — ABNORMAL HIGH (ref 65–99)

## 2017-11-10 LAB — PROTIME-INR
INR: 1.03
PROTHROMBIN TIME: 13.4 s (ref 11.4–15.2)

## 2017-11-10 SURGERY — LEFT HEART CATH AND CORONARY ANGIOGRAPHY
Anesthesia: LOCAL

## 2017-11-10 MED ORDER — CARVEDILOL 3.125 MG PO TABS: 3.1250 mg | ORAL_TABLET | Freq: Two times a day (BID) | ORAL | 0 refills | Status: AC

## 2017-11-10 MED ORDER — LIDOCAINE HCL 1 % IJ SOLN
INTRAMUSCULAR | Status: AC
  Filled 2017-11-10: qty 20

## 2017-11-10 MED ORDER — SODIUM CHLORIDE 0.9 % IV SOLN: 250.0000 mL | INTRAVENOUS | Status: DC | PRN

## 2017-11-10 MED ORDER — HEPARIN (PORCINE) IN NACL 2-0.9 UNIT/ML-% IJ SOLN
INTRAMUSCULAR | Status: AC
  Filled 2017-11-10: qty 1000

## 2017-11-10 MED ORDER — IOPAMIDOL (ISOVUE-370) INJECTION 76%
INTRAVENOUS | Status: AC
  Filled 2017-11-10: qty 100

## 2017-11-10 MED ORDER — MIDAZOLAM HCL 2 MG/2ML IJ SOLN
INTRAMUSCULAR | Status: AC
  Filled 2017-11-10: qty 2

## 2017-11-10 MED ORDER — SODIUM CHLORIDE 0.9% FLUSH
3.0000 mL | INTRAVENOUS | Status: DC | PRN
  Filled 2017-11-10: qty 3

## 2017-11-10 MED ORDER — FENTANYL CITRATE (PF) 100 MCG/2ML IJ SOLN
INTRAMUSCULAR | Status: AC
  Filled 2017-11-10: qty 2

## 2017-11-10 MED ORDER — SODIUM CHLORIDE 0.9 % IV SOLN: INTRAVENOUS | Status: AC

## 2017-11-10 MED ORDER — SODIUM CHLORIDE 0.9% FLUSH: 3.0000 mL | Freq: Two times a day (BID) | INTRAVENOUS | Status: DC

## 2017-11-10 MED ORDER — FENTANYL CITRATE (PF) 100 MCG/2ML IJ SOLN
INTRAMUSCULAR | Status: DC | PRN
  Administered 2017-11-10: 25 ug via INTRAVENOUS

## 2017-11-10 MED ORDER — MIDAZOLAM HCL 2 MG/2ML IJ SOLN
INTRAMUSCULAR | Status: DC | PRN
  Administered 2017-11-10: 1 mg via INTRAVENOUS

## 2017-11-10 MED ORDER — LIDOCAINE HCL (PF) 1 % IJ SOLN
INTRAMUSCULAR | Status: DC | PRN
  Administered 2017-11-10: 16 mL

## 2017-11-10 MED ORDER — IOPAMIDOL (ISOVUE-370) INJECTION 76%
INTRAVENOUS | Status: DC | PRN
  Administered 2017-11-10: 85 mL via INTRA_ARTERIAL

## 2017-11-10 MED ORDER — HEPARIN (PORCINE) IN NACL 2-0.9 UNIT/ML-% IJ SOLN
INTRAMUSCULAR | Status: AC | PRN
  Administered 2017-11-10 (×2): 500 mL

## 2017-11-10 MED ORDER — LOSARTAN POTASSIUM 50 MG PO TABS
50.0000 mg | ORAL_TABLET | Freq: Every day | ORAL | Status: DC
  Administered 2017-11-10: 50 mg via ORAL
  Filled 2017-11-10: qty 1

## 2017-11-10 MED ORDER — LOSARTAN POTASSIUM 50 MG PO TABS: 50.0000 mg | ORAL_TABLET | Freq: Every day | ORAL | 0 refills | Status: AC

## 2017-11-10 SURGICAL SUPPLY — 7 items
CATH INFINITI 5FR MULTPACK ANG (CATHETERS) ×1 IMPLANT
KIT HEART LEFT (KITS) ×2 IMPLANT
PACK CARDIAC CATHETERIZATION (CUSTOM PROCEDURE TRAY) ×2 IMPLANT
SHEATH PINNACLE 5F 10CM (SHEATH) ×1 IMPLANT
SYR MEDRAD MARK V 150ML (SYRINGE) ×2 IMPLANT
TRANSDUCER W/STOPCOCK (MISCELLANEOUS) ×2 IMPLANT
WIRE EMERALD 3MM-J .035X150CM (WIRE) ×1 IMPLANT

## 2017-11-10 NOTE — Discharge Instructions (Addendum)
Nonspecific Chest Pain Chest pain can be caused by many different conditions. There is a chance that your pain could be related to something serious, such as a heart attack or a blood clot in your lungs. Chest pain can also be caused by conditions that are not life-threatening. If you have chest pain, it is very important to follow up with your doctor. Follow these instructions at home: Medicines  If you were prescribed an antibiotic medicine, take it as told by your doctor. Do not stop taking the antibiotic even if you start to feel better.  Take over-the-counter and prescription medicines only as told by your doctor. Lifestyle  Do not use any products that contain nicotine or tobacco, such as cigarettes and e-cigarettes. If you need help quitting, ask your doctor.  Do not drink alcohol.  Make lifestyle changes as told by your doctor. These may include: ? Getting regular exercise. Ask your doctor for some activities that are safe for you. ? Eating a heart-healthy diet. A diet specialist (dietitian) can help you to learn healthy eating options. ? Staying at a healthy weight. ? Managing diabetes, if needed. ? Lowering your stress, as with deep breathing or spending time in nature. General instructions  Avoid any activities that make you feel chest pain.  If your chest pain is because of heartburn: ? Raise (elevate) the head of your bed about 6 inches (15 cm). You can do this by putting blocks under the bed legs at the head of the bed. ? Do not sleep with extra pillows under your head. That does not help heartburn.  Keep all follow-up visits as told by your doctor. This is important. This includes any further testing if your chest pain does not go away. Contact a doctor if:  Your chest pain does not go away.  You have a rash with blisters on your chest.  You have a fever.  You have chills. Get help right away if:  Your chest pain is worse.  You have a cough that gets worse, or  you cough up blood.  You have very bad (severe) pain in your belly (abdomen).  You are very weak.  You pass out (faint).  You have either of these for no clear reason: ? Sudden chest discomfort. ? Sudden discomfort in your arms, back, neck, or jaw.  You have shortness of breath at any time.  You suddenly start to sweat, or your skin gets clammy.  You feel sick to your stomach (nauseous).  You throw up (vomit).  You suddenly feel light-headed or dizzy.  Your heart starts to beat fast, or it feels like it is skipping beats. These symptoms may be an emergency. Do not wait to see if the symptoms will go away. Get medical help right away. Call your local emergency services (911 in the [place] Do not drive yourself to the hospital. This information is not intended to replace advice given to you by your health care provider. Make sure you discuss any questions you have with your health care provider. Document Released: 02/23/2008 Document Revised: 05/31/2016 Document Reviewed: 05/31/2016 Elsevier Interactive Patient Education  2017 [employer]    Angiogram, Care After This sheet gives you information about how to care for yourself after your procedure. Your doctor may also give you more specific instructions. If you have problems or questions, contact your doctor. Follow these instructions at home: Insertion site care  Follow instructions from your doctor about how to take care of your long,  thin tube (catheter) insertion area. Make sure you: ? Wash your hands with soap and water before you change your bandage (dressing). If you cannot use soap and water, use hand sanitizer. ? Change your bandage as told by your doctor. ? Leave stitches (sutures), skin glue, or skin tape (adhesive) strips in place. They may need to stay in place for 2 weeks or longer. If tape strips get loose and curl up, you may trim the loose edges. Do not remove tape strips completely unless your doctor says it is  okay.  Do not take baths, swim, or use a hot tub until your doctor says it is okay.  You may shower 24-48 hours after the procedure or as told by your doctor. ? Gently wash the area with plain soap and water. ? Pat the area dry with a clean towel. ? Do not rub the area. This may cause bleeding.  Do not apply powder or lotion to the area. Keep the area clean and dry.  Check your insertion area every day for signs of infection. Check for: ? More redness, swelling, or pain. ? Fluid or blood. ? Warmth. ? Pus or a bad smell. Activity  Rest as told by your doctor, usually for 1-2 days.  Do not lift anything that is heavier than 10 lbs. (4.5 kg) or as told by your doctor.  Do not drive for 24 hours if you were given a medicine to help you relax (sedative).  Do not drive or use heavy machinery while taking prescription pain medicine. General instructions  Go back to your normal activities as told by your doctor, usually in about a week. Ask your doctor what activities are safe for you.  If the insertion area starts to bleed, lie flat and put pressure on the area. If the bleeding does not stop, get help right away. This is an emergency.  Drink enough fluid to keep your pee (urine) clear or pale yellow.  Take over-the-counter and prescription medicines only as told by your doctor.  Keep all follow-up visits as told by your doctor. This is important. Contact a doctor if:  You have a fever.  You have chills.  You have more redness, swelling, or pain around your insertion area.  You have fluid or blood coming from your insertion area.  The insertion area feels warm to the touch.  You have pus or a bad smell coming from your insertion area.  You have more bruising around the insertion area.  Blood collects in the tissue around the insertion area (hematoma) that may be painful to the touch. Get help right away if:  You have a lot of pain in the insertion area.  The insertion  area swells very fast.  The insertion area is bleeding, and the bleeding does not stop after holding steady pressure on the area.  The area near or just beyond the insertion area becomes pale, cool, tingly, or numb. These symptoms may be an emergency. Do not wait to see if the symptoms will go away. Get medical help right away. Call your local emergency services (911 in the [place] Do not drive yourself to the hospital. Summary  After the procedure, it is common to have bruising and tenderness at the long, thin tube insertion area.  After the procedure, it is important to rest and drink plenty of fluids.  Do not take baths, swim, or use a hot tub until your doctor says it is okay to do so. You may  shower 24-48 hours after the procedure or as told by your doctor.  If the insertion area starts to bleed, lie flat and put pressure on the area. If the bleeding does not stop, get help right away. This is an emergency. This information is not intended to replace advice given to you by your health care provider. Make sure you discuss any questions you have with your health care provider. Document Released: 12/03/2008 Document Revised: 08/31/2016 Document Reviewed: 08/31/2016 Elsevier Interactive Patient Education  2017 [employer]

## 2017-11-10 NOTE — Progress Notes (Signed)
Per Dr Sharyn LullHarwani have patient resume sq lovenox tomorrow.  This instruction added to AVS by RN.

## 2017-11-10 NOTE — Progress Notes (Signed)
Site area: Right groin a 5 french arterial sheath was pulled by Silvestre GunnerStacey Perkins RN  Site Prior to Removal:  Level 0  Pressure Applied For 20 MINUTES    Bedrest Beginning at 0900am  Manual:   Yes.    Patient Status During Pull:  stable  Post Pull Groin Site:  Level 0  Post Pull Instructions Given:  Yes.    Post Pull Pulses Present:  Yes.    Dressing Applied:  Yes.    Comments:  VS remain stable

## 2017-11-10 NOTE — Discharge Summary (Signed)
Discharge Summary  Tim Garcia ZOX:[id number] DOB: 1960-10-07  PCP: Patient, No Pcp Per  Admit date: 11/07/2017 Discharge date: 11/10/2017  Time spent: 25 minutes   Recommendations for Outpatient Follow-up:  1. Follow up with PCP 2. Follow up with cardiology 3. Take your medications as prescribed  Discharge Diagnoses:  Active Hospital Problems   Diagnosis Date Noted  . Chest pain 11/07/2017  . History of pulmonary embolism 11/07/2017  . CAD (coronary artery disease) 11/07/2017  . GERD 10/02/2006    Resolved Hospital Problems  No resolved problems to display.    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 Patient also reports burping and worsening acid reflux. He has a GI appointment at [facility] this month. Patient was admitted for chest pain rule out.  11/10/17:  LHC done no new stent placed in.  On the day of discharge the patient was hemodynamically stable. Was able to ambulate without any difficulty and was chest pain free.   Hospital Course:  Principal Problem:   Chest pain Active Problems:   GERD   History of pulmonary embolism   CAD (coronary artery disease)  Chest pain r/o ACS -troponin negative x3 -CAD with past stent -cardiology followed -LHC  11/10/17  Chronic heart failure with reduced EF 40-45% -2D echo  -continue BB, statin, asa, ARB  CAD with previous MI -continue ASA, plavix, statin and beta blocker  Hx of PE -on full dose lovonox injection at home -restart lovenox full dose post procedure  Depression/anxiety -continue zoloft, hydroxyzine  GERD -stable -continue  prilosec  Chronic back pain -stable -continue meloxicam -continue tylenol prn  Morbid obesity -recommend weight loss when more stable -outpatient follow up   Procedures:  Left heart cath 11/10/17  Consultations:  cardiology  Discharge Exam: BP (!) 106/54   Pulse 61   Temp 97.9 F (36.6 C) (Oral)   Resp (!) 8   Ht 6\' 1"  (1.854 m)   Wt 123.4 kg (272 lb)   SpO2 96%   BMI 35.89 kg/m   General: 57 yo AAM WD WN NAD A&O x 3  Cardiovascular: RRR no rubs or gallops  Respiratory: CTA no wheezes or rales  Discharge Instructions You were cared for by a hospitalist during your hospital stay. If you have any questions about your discharge medications or the care you received while you were in the hospital after you are discharged, you can call the unit and asked to speak with the hospitalist on call if the hospitalist that took care of you is not available. Once you are discharged, your primary care physician will handle any further medical issues. Please note that NO REFILLS for any discharge medications will be authorized once you are discharged, as it is imperative that you return to your primary care physician (or establish a relationship with a primary care physician if you do not have one) for your aftercare needs so that they can reassess your need for medications and monitor your lab values.   Allergies as of 11/10/2017      Reactions   Hydrocodone Itching   Takes  hydroxyzine to stop reaction 11/10/2017-patient states he is not allergic to hydrocodone and still takes it at home and the premeds were on the advice of an ER doctor from a prior visit.   Latex Rash   Lisinopril Cough      Medication List    STOP taking these medications   atenolol 25 MG tablet Commonly known as:  TENORMIN   meloxicam 15 MG tablet Commonly known as:  MOBIC   omeprazole 20 MG capsule Commonly known as:  PRILOSEC     TAKE these medications   acetaminophen 325 MG tablet Commonly known  as:  TYLENOL Take 650 mg by mouth 2 (two) times daily.   aspirin EC 81 MG tablet Take 81 mg by mouth at bedtime.   atorvastatin 80 MG tablet Commonly known as:  LIPITOR Take 40 mg by mouth daily at 6 PM.   carvedilol 3.125 MG tablet Commonly known as:  COREG Take 1 tablet (3.125 mg total) by mouth 2 (two) times daily with a meal.   cyanocobalamin 1000 MCG tablet Take 1,000 mcg by mouth at bedtime.   HYDROcodone-acetaminophen 5-325 MG tablet Commonly known as:  NORCO/VICODIN Take 1 tablet by mouth every 6 (six) hours as needed for moderate pain or severe pain. What changed:  reasons to take this   hydrOXYzine 10 MG tablet Commonly known as:  ATARAX/VISTARIL Take 20 mg by mouth 2 (two) times daily as needed for itching.   losartan 50 MG tablet Commonly known as:  COZAAR Take 1 tablet (50 mg total) by mouth daily. Start taking on:  11/11/2017   LOVENOX 150 MG/ML injection Generic drug:  enoxaparin Inject 135 mg into the skin every 12 (twelve) hours. Per the patient and spouse   pantoprazole 40 MG tablet Commonly known as:  PROTONIX Take 1 tablet (40 mg total) by mouth 2 (two) times daily before a meal.   sertraline 100 MG tablet Commonly known as:  ZOLOFT Take 100 mg by mouth at bedtime.      Allergies  Allergen Reactions  . Hydrocodone Itching    Takes hydroxyzine to stop reaction 11/10/2017-patient states he is not allergic to hydrocodone and still takes it at home and the premeds were on the advice of an ER doctor from a prior visit.  . Latex Rash  . Lisinopril Cough   Follow-up Information    Rinaldo Cloud, MD Follow up in 1 week(s).   Specialty:  Cardiology Contact information: [address] [place] [address] [phone]            The results of significant diagnostics from this hospitalization (including imaging, microbiology, ancillary and laboratory) are listed below for reference.    Significant Diagnostic Studies: Ct  Angio Chest Pe W And/or Wo Contrast  Result Date: 11/07/2017 CLINICAL DATA:  Dizziness. Shortness of breath and chest discomfort. EXAM: CT ANGIOGRAPHY CHEST WITH CONTRAST TECHNIQUE: Multidetector CT imaging of the chest was performed using the standard protocol during bolus administration of intravenous contrast. Multiplanar CT image reconstructions and MIPs were obtained to evaluate the vascular anatomy. CONTRAST:  ISOVUE-370 IOPAMIDOL (ISOVUE-370) INJECTION 76% COMPARISON:  CTA chest dated September 18, 2006. FINDINGS: Cardiovascular: Satisfactory opacification of the pulmonary arteries to the segmental level. No evidence of pulmonary embolism. Normal heart size. No pericardial effusion. Normal caliber thoracic aorta. Coronary artery atherosclerosis. Mediastinum/Nodes: No enlarged mediastinal, hilar, or axillary lymph nodes. Subcentimeter right hilar lymph nodes are likely reactive. Thyroid gland, trachea, and esophagus demonstrate no significant  findings. Lungs/Pleura: Bibasilar atelectasis. No focal consolidation, pleural effusion, or pneumothorax. No suspicious pulmonary nodule. Upper Abdomen: No acute abnormality. Musculoskeletal: No chest wall abnormality. No acute or significant osseous findings. Review of the MIP images confirms the above findings. IMPRESSION: 1. No evidence of pulmonary embolism. No acute intrathoracic process. Electronically Signed   By: Obie Dredge M.D.   On: 11/07/2017 14:42   Nm Myocar Multi W/spect W/wall Motion / Ef  Result Date: 11/08/2017 CLINICAL DATA:  Chest pain. Prior myocardial infarction. Hypertension and coronary artery disease. EXAM: MYOCARDIAL IMAGING WITH SPECT (REST AND PHARMACOLOGIC-STRESS) GATED LEFT VENTRICULAR WALL MOTION STUDY LEFT VENTRICULAR EJECTION FRACTION TECHNIQUE: Standard myocardial SPECT imaging was performed after resting intravenous injection of 10 mCi Tc-26m tetrofosmin. Subsequently, intravenous infusion of Lexiscan was performed under  the supervision of the Cardiology staff. At peak effect of the drug, 30 mCi Tc-34m tetrofosmin was injected intravenously and standard myocardial SPECT imaging was performed. Quantitative gated imaging was also performed to evaluate left ventricular wall motion, and estimate left ventricular ejection fraction. COMPARISON:  Report from 10/26/2012 FINDINGS: Perfusion: Large matched perfusion defects in the cardiac apex, anterior wall, inferior wall, and septum. Small region of inducible ischemia in the anterior wall mid heart with 15% reduction in activity on stress images compared to rest images. Borderline inducible ischemia along the inferior wall in a small region, with 10% reduction in activity-perhaps not reliable given the significant degree of diaphragmatic attenuation observed on the raw images. Wall Motion: Global poor wall thickening. Hypokinesis in the cardiac apex and lateral wall. Left Ventricular Ejection Fraction: 37 % End diastolic volume 178 ml (mildly high) End systolic volume 112 ml (prominently high) IMPRESSION: 1. Considerable scarring in the left ventricle as detailed above, with a small region of inducible ischemia in the anterior wall and an equivocal region of inducible ischemia in the inferior wall. 2. Global poor wall thickening with hypokinesis in the cardiac apex and lateral wall. 3. Left ventricular ejection fraction 37% 4. Non invasive risk stratification*: High *2012 Appropriate Use Criteria for Coronary Revascularization Focused Update: J Am Coll Cardiol. 2012;59(9):857-881. http://content.dementiazones.com.aspx?articleid=[email or web address] Electronically Signed   By: Gaylyn Rong M.D.   On: 11/08/2017 12:40    Microbiology: No results found for this or any previous visit (from the past 240 hour(s)).   Labs: Basic Metabolic Panel: Recent Labs  Lab 11/07/17 1321 11/09/17 0500  NA 140 141  K 4.2 4.0  CL 107 105  CO2 25 24  GLUCOSE 130* 88  BUN 5* 6  CREATININE 0.91  0.90  CALCIUM 8.7* 9.0   Liver Function Tests: No results for input(s): AST, ALT, ALKPHOS, BILITOT, PROT, ALBUMIN in the last 168 hours. No results for input(s): LIPASE, AMYLASE in the last 168 hours. No results for input(s): AMMONIA in the last 168 hours. CBC: Recent Labs  Lab 11/07/17 1321 11/09/17 0500  WBC 4.4 4.7  NEUTROABS 2.5  --   HGB 11.9* 11.5*  HCT 39.7 37.9*  MCV 90.4 89.4  PLT 215 233   Cardiac Enzymes: Recent Labs  Lab 11/07/17 1932 11/07/17 2140  TROPONINI <0.03 <0.03   BNP: BNP (last 3 results) No results for input(s): BNP in the last 8760 hours.  ProBNP (last 3 results) No results for input(s): PROBNP in the last 8760 hours.  CBG: Recent Labs  Lab 11/09/17 1127 11/09/17 1628 11/10/17 0839 11/10/17 0936 11/10/17 1114  GLUCAP 108* 130* 94 96 158*       Signed:  Darlin Drop, MD [facility] 11/10/2017, 2:26 PM

## 2017-11-10 NOTE — Interval H&P Note (Signed)
Cath Lab Visit (complete for each Cath Lab visit)  Clinical Evaluation Leading to the Procedure:   ACS: No.  Non-ACS:    Anginal Classification: CCS IV  Anti-ischemic medical therapy: Maximal Therapy (2 or more classes of medications)  Non-Invasive Test Results: Intermediate-risk stress test findings: cardiac mortality 1-3%/year  Prior CABG: No previous CABG      History and Physical Interval Note:  11/10/2017 7:22 AM  Tim Garcia  has presented today for surgery, with the diagnosis of ua  The various methods of treatment have been discussed with the patient and family. After consideration of risks, benefits and other options for treatment, the patient has consented to  Procedure(s): LEFT HEART CATH AND CORONARY ANGIOGRAPHY (N/A) as a surgical intervention .  The patient's history has been reviewed, patient examined, no change in status, stable for surgery.  I have reviewed the patient's chart and labs.  Questions were answered to the patient's satisfaction.     Tim Garcia, Tim Garcia

## 2017-11-11 MED FILL — Lidocaine HCl Local Inj 1%: INTRAMUSCULAR | Qty: 20 | Status: AC

## 2017-11-11 MED FILL — Heparin Sodium (Porcine) 2 Unit/ML in Sodium Chloride 0.9%: INTRAMUSCULAR | Qty: 1000 | Status: AC

## 2019-07-23 ENCOUNTER — Emergency Department (HOSPITAL_COMMUNITY)
Admission: EM | Admit: 2019-07-23 | Discharge: 2019-07-23 | Disposition: A | Discharge status: Home / Self Care | Payer: No Typology Code available for payment source | Attending: Emergency Medicine | Admitting: Emergency Medicine

## 2019-07-23 ENCOUNTER — Emergency Department (HOSPITAL_COMMUNITY): Payer: No Typology Code available for payment source

## 2019-07-23 DIAGNOSIS — Z87891 Personal history of nicotine dependence: Secondary | ICD-10-CM | POA: Insufficient documentation

## 2019-07-23 DIAGNOSIS — Z7982 Long term (current) use of aspirin: Secondary | ICD-10-CM | POA: Diagnosis not present

## 2019-07-23 DIAGNOSIS — I2510 Atherosclerotic heart disease of native coronary artery without angina pectoris: Secondary | ICD-10-CM | POA: Diagnosis not present

## 2019-07-23 DIAGNOSIS — R0789 Other chest pain: Secondary | ICD-10-CM | POA: Diagnosis not present

## 2019-07-23 DIAGNOSIS — Z79899 Other long term (current) drug therapy: Secondary | ICD-10-CM | POA: Diagnosis not present

## 2019-07-23 DIAGNOSIS — Z91040 Latex allergy status: Secondary | ICD-10-CM | POA: Insufficient documentation

## 2019-07-23 DIAGNOSIS — I10 Essential (primary) hypertension: Secondary | ICD-10-CM | POA: Insufficient documentation

## 2019-07-23 DIAGNOSIS — R079 Chest pain, unspecified: Secondary | ICD-10-CM | POA: Diagnosis present

## 2019-07-23 LAB — BASIC METABOLIC PANEL
Anion gap: 11 (ref 5–15)
BUN: 10 mg/dL (ref 6–20)
CO2: 24 mmol/L (ref 22–32)
Calcium: 9.1 mg/dL (ref 8.9–10.3)
Chloride: 105 mmol/L (ref 98–111)
Creatinine, Ser: 1.16 mg/dL (ref 0.61–1.24)
GFR calc Af Amer: >60 mL/min (ref >60)
GFR calc non Af Amer: >60 mL/min (ref >60)
Glucose, Bld: 117 mg/dL — ABNORMAL HIGH (ref 70–99)
Potassium: 4.2 mmol/L (ref 3.5–5.1)
Sodium: 140 mmol/L (ref 135–145)

## 2019-07-23 LAB — CBC
HCT: 38.4 % — ABNORMAL LOW (ref 39.0–52.0)
Hemoglobin: 11.9 g/dL — ABNORMAL LOW (ref 13.0–17.0)
MCH: 28.1 pg (ref 26.0–34.0)
MCHC: 31 g/dL (ref 30.0–36.0)
MCV: 90.8 fL (ref 80.0–100.0)
Platelets: 222 10*3/uL (ref 150–400)
RBC: 4.23 MIL/uL (ref 4.22–5.81)
RDW: 13.2 % (ref 11.5–15.5)
WBC: 5.2 10*3/uL (ref 4.0–10.5)
nRBC: 0 % (ref 0.0–0.2)

## 2019-07-23 LAB — TROPONIN I (HIGH SENSITIVITY)
Troponin I (High Sensitivity): 4 ng/L (ref <18)
Troponin I (High Sensitivity): 5 ng/L (ref <18)

## 2019-07-23 LAB — CBG MONITORING, ED: Glucose-Capillary: 124 mg/dL — ABNORMAL HIGH (ref 70–99)

## 2019-07-23 MED ORDER — SODIUM CHLORIDE 0.9% FLUSH
3.0000 mL | Freq: Once | INTRAVENOUS | Status: AC
  Administered 2019-07-23: 03:00:00 3 mL via INTRAVENOUS

## 2019-07-23 NOTE — ED Provider Notes (Signed)
[place] EMERGENCY DEPARTMENT Provider Note   CSN: [id number] Arrival date & time:       History   Chief Complaint Chief Complaint  Patient presents with  . Chest Pain    HPI Tim Garcia is a 58 y.o. male.   The history is provided by the patient.  He has history of hypertension, coronary artery disease and comes in following an episode of tightness in his throat which is similar to what he had with a prior heart attack.  There is associated dyspnea and diaphoresis as well as some numbness in his left arm.  Symptoms started at about 1:30 AM and had resolved by the time an ambulance had gotten there.  He did notice that symptoms seem to be better with walking around, but nothing made it worse.  He had taken a dose of hydrocodone and was wondering if it might have been a side effect of the medication.  EMS gave him aspirin and nitroglycerin, and blood pressure has dropped following nitroglycerin.  He states that he quit smoking when he had his heart attack 12 years ago.  Past Medical History:  Diagnosis Date  . Hypertension   . MI (myocardial infarction) [facility])     Patient Active Problem List   Diagnosis Date Noted  . Chest pain 11/07/2017  . History of pulmonary embolism 11/07/2017  . CAD (coronary artery disease) 11/07/2017  . GERD 10/02/2006    Past Surgical History:  Procedure Laterality Date  . ANGIOPLASTY    . JOINT REPLACEMENT    . LEFT HEART CATH AND CORONARY ANGIOGRAPHY N/A 11/10/2017   Procedure: LEFT HEART CATH AND CORONARY ANGIOGRAPHY;  Surgeon: Charolette Forward, MD;  Location: [place] [facility];  Service: Cardiovascular;  Laterality: N/A;        Home Medications    Prior to Admission medications   Medication Sig Start Date End Date Taking? Authorizing Provider  acetaminophen (TYLENOL) 325 MG tablet Take 650 mg by mouth 2 (two) times daily.    [provider]  aspirin EC 81 MG tablet Take 81 mg by mouth at bedtime.      [provider]  atorvastatin (LIPITOR) 80 MG tablet Take 40 mg by mouth daily at 6 PM.    [provider]  carvedilol (COREG) 3.125 MG tablet Take 1 tablet (3.125 mg total) by mouth 2 (two) times daily with a meal. 11/10/17   Kayleen Memos, DO  cyanocobalamin 1000 MCG tablet Take 1,000 mcg by mouth at bedtime.     [provider]  enoxaparin (LOVENOX) 150 MG/ML injection Inject 135 mg into the skin every 12 (twelve) hours. Per the patient and spouse    [provider]  HYDROcodone-acetaminophen (NORCO/VICODIN) 5-325 MG tablet Take 1 tablet by mouth every 6 (six) hours as needed for moderate pain or severe pain. 11/08/17   Rai, Vernelle Emerald, MD  hydrOXYzine (ATARAX/VISTARIL) 10 MG tablet Take 20 mg by mouth 2 (two) times daily as needed for itching.    [provider]  losartan (COZAAR) 50 MG tablet Take 1 tablet (50 mg total) by mouth daily. 11/11/17   Kayleen Memos, DO  pantoprazole (PROTONIX) 40 MG tablet Take 1 tablet (40 mg total) by mouth 2 (two) times daily before a meal. 11/08/17   Rai, Ripudeep K, MD  sertraline (ZOLOFT) 100 MG tablet Take 100 mg by mouth at bedtime.     [provider]    Family History No family history  on file.  Social History Social History   Tobacco Use  . Smoking status: Former Smoker    Packs/day: 0.50    Types: Cigarettes  . Smokeless tobacco: Never Used  Substance Use Topics  . Alcohol use: No  . Drug use: Not on file     Allergies   Hydrocodone, Latex, and Lisinopril   Review of Systems Review of Systems  All other systems reviewed and are negative.    Physical Exam Updated Vital Signs BP (!) 85/56 (BP Location: Left Arm)   Pulse 95   Temp 98.5 F (36.9 C) (Oral)   Resp 16   SpO2 94%   Physical Exam Vitals signs and nursing note reviewed.    58 year old male, resting comfortably and in no acute distress. Vital signs are significant for low blood pressure. Oxygen saturation is 94%,  which is normal. Head is normocephalic and atraumatic. PERRLA, EOMI. Oropharynx is clear. Neck is nontender and supple without adenopathy or JVD. Back is nontender and there is no CVA tenderness. Lungs are clear without rales, wheezes, or rhonchi. Chest is nontender. Heart has regular rate and rhythm without murmur. Abdomen is soft, flat, nontender without masses or hepatosplenomegaly and peristalsis is normoactive. Extremities have trace edema, full range of motion is present. Skin is warm and dry without rash. Neurologic: Mental status is normal, cranial nerves are intact, there are no motor or sensory deficits.  ED Treatments / Results  Labs (all labs ordered are listed, but only abnormal results are displayed) Labs Reviewed  BASIC METABOLIC PANEL - Abnormal; Notable for the following components:      Result Value   Glucose, Bld 117 (*)    All other components within normal limits  CBC - Abnormal; Notable for the following components:   Hemoglobin 11.9 (*)    HCT 38.4 (*)    All other components within normal limits  TROPONIN I (HIGH SENSITIVITY)  TROPONIN I (HIGH SENSITIVITY)    EKG EKG Interpretation  Date/Time:  Monday July 23 2019 02:29:09 EST Ventricular Rate:  77 PR Interval:    QRS Duration: 97 QT Interval:  365 QTC Calculation: 413 R Axis:   -79 Text Interpretation: Sinus rhythm Left anterior fascicular block Anteroseptal infarct, old Baseline wander in lead(s) V1 When compared with ECG of 11/07/2017, No significant change was found Confirmed by Dione Booze ([id number]) on 07/23/2019 2:41:21 AM   Radiology Dg Chest 2 View  Result Date: 07/23/2019 CLINICAL DATA:  Chest pain EXAM: CHEST - 2 VIEW COMPARISON:  01/26/2015 FINDINGS: Heart and mediastinal contours are within normal limits. No focal opacities or effusions. No acute bony abnormality. IMPRESSION: No active cardiopulmonary disease. Electronically Signed   By: Charlett Nose M.D.   On: 07/23/2019 02:55     Procedures Procedures   Medications Ordered in ED Medications  sodium chloride flush (NS) 0.9 % injection 3 mL (has no administration in time range)     Initial Impression / Assessment and Plan / ED Course  I have reviewed the triage vital signs and the nursing notes.  Pertinent labs & imaging results that were available during my care of the patient were reviewed by me and considered in my medical decision making (see chart for details).  Chest discomfort and patient with history of coronary artery disease.  Old records are reviewed, and he had a cardiac catheterization in February 2019 at which time he was noted to have 30% lesions in his LAD and RCA, widely patent RCA stent,  20% lesion in circumflex, decreased ejection fraction of 35-45%.  With only mild coronary disease noted on catheterization 2 years ago, it is unlikely that this represents ACS.  However, will check high-sensitivity troponin x2.  He has been given IV fluids because of hypotension following nitroglycerin administration.  Blood pressure is come up with IV fluids.  Troponin is normal x2, and remainder of lab tests are unremarkable.  Chest x-ray shows no acute process.  ECG is unchanged from baseline.  No evidence of ACS, patient is felt to be safe for discharge.  [facility].  Final Clinical Impressions(s) / ED Diagnoses   Final diagnoses:  Atypical chest pain    ED Discharge Orders    None       Dione BoozeGlick, Rosemary Mossbarger, MD 07/23/19 [phone]

## 2019-07-23 NOTE — Discharge Instructions (Signed)
Your evaluation today did not show any signs of any heart problems.  Continue your current medications.  Return if you are having any problems.

## 2019-07-23 NOTE — ED Notes (Signed)
Patient transported to X-ray 

## 2019-07-23 NOTE — ED Triage Notes (Signed)
Reported chest discomfort approx 1 hr ago; ems gave NTG 0.4 x1 + ASA 324 mg. Pt reported prior heart attack 12 years ago and had similar symptoms.

## 2019-10-05 ENCOUNTER — Ambulatory Visit: Payer: No Typology Code available for payment source | Admitting: Family Medicine

## 2019-10-11 ENCOUNTER — Other Ambulatory Visit: Payer: Self-pay

## 2019-10-11 ENCOUNTER — Ambulatory Visit: Payer: Self-pay | Admitting: Family

## 2019-10-12 ENCOUNTER — Other Ambulatory Visit: Payer: Self-pay

## 2019-10-12 ENCOUNTER — Ambulatory Visit: Payer: No Typology Code available for payment source | Attending: Family Medicine | Admitting: Family Medicine

## 2020-01-08 ENCOUNTER — Other Ambulatory Visit (HOSPITAL_BASED_OUTPATIENT_CLINIC_OR_DEPARTMENT_OTHER): Payer: Self-pay | Admitting: Orthopedic Surgery

## 2020-01-08 DIAGNOSIS — R52 Pain, unspecified: Secondary | ICD-10-CM

## 2020-01-08 DIAGNOSIS — R609 Edema, unspecified: Secondary | ICD-10-CM

## 2020-01-09 ENCOUNTER — Other Ambulatory Visit (HOSPITAL_COMMUNITY): Payer: Self-pay | Admitting: Orthopedic Surgery

## 2020-01-09 DIAGNOSIS — M79604 Pain in right leg: Secondary | ICD-10-CM

## 2020-01-09 DIAGNOSIS — M79605 Pain in left leg: Secondary | ICD-10-CM

## 2020-01-15 ENCOUNTER — Ambulatory Visit (HOSPITAL_COMMUNITY): Admission: RE | Admit: 2020-01-15 | Payer: No Typology Code available for payment source | Source: Ambulatory Visit

## 2020-02-25 ENCOUNTER — Other Ambulatory Visit (HOSPITAL_COMMUNITY): Payer: Self-pay | Admitting: Orthopedic Surgery

## 2020-02-25 ENCOUNTER — Ambulatory Visit (HOSPITAL_COMMUNITY): Admission: RE | Admit: 2020-02-25 | Payer: No Typology Code available for payment source | Source: Ambulatory Visit

## 2020-02-25 DIAGNOSIS — M79604 Pain in right leg: Secondary | ICD-10-CM

## 2020-02-25 DIAGNOSIS — M79605 Pain in left leg: Secondary | ICD-10-CM

## 2020-02-25 DIAGNOSIS — M79661 Pain in right lower leg: Secondary | ICD-10-CM

## 2020-02-25 DIAGNOSIS — M79662 Pain in left lower leg: Secondary | ICD-10-CM

## 2022-03-25 ENCOUNTER — Ambulatory Visit
Admission: EM | Admit: 2022-03-25 | Discharge: 2022-03-25 | Disposition: A | Discharge status: Home / Self Care | Payer: No Typology Code available for payment source | Attending: Family Medicine | Admitting: Family Medicine

## 2022-03-25 DIAGNOSIS — J069 Acute upper respiratory infection, unspecified: Secondary | ICD-10-CM | POA: Diagnosis not present

## 2022-03-25 NOTE — ED Triage Notes (Addendum)
Pt present covid exposure with symptoms of coughing, headache and fever. Symptoms started on Monday.  Pt states people he was around tested positive from being around members in the choir

## 2022-03-25 NOTE — ED Provider Notes (Signed)
[facility]    CSN: [id number] Arrival date & time: 03/25/22  1537      History   Chief Complaint Chief Complaint  Patient presents with   Covid Exposure    HPI Tim Garcia is a 61 y.o. male.   HPI \\Here  for cough and congestion and fever.  His symptoms began on July 3.  All but the cough have improved.  He also has noted some wheezing.  No weakness and no vomiting or diarrhea  He was exposed to a large group on July 1.  He later received a message saying that several people had tested positive for COVID who had been at that event.  His daughter did test positive after that, but his wife tested negative.  He does have a history of coronary artery disease.  Past Medical History:  Diagnosis Date   Hypertension    MI (myocardial infarction) [facility])     Patient Active Problem List   Diagnosis Date Noted   Chest pain 11/07/2017   History of pulmonary embolism 11/07/2017   CAD (coronary artery disease) 11/07/2017   GERD 10/02/2006    Past Surgical History:  Procedure Laterality Date   ANGIOPLASTY     JOINT REPLACEMENT     LEFT HEART CATH AND CORONARY ANGIOGRAPHY N/A 11/10/2017   Procedure: LEFT HEART CATH AND CORONARY ANGIOGRAPHY;  Surgeon: Rinaldo Cloud, MD;  Location: [facility];  Service: Cardiovascular;  Laterality: N/A;       Home Medications    Prior to Admission medications   Medication Sig Start Date End Date Taking? Authorizing Provider  acetaminophen (TYLENOL) 325 MG tablet Take 650 mg by mouth 2 (two) times daily.    [provider]  aspirin EC 81 MG tablet Take 81 mg by mouth at bedtime.     [provider]  atorvastatin (LIPITOR) 80 MG tablet Take 40 mg by mouth daily at 6 PM.    [provider]  carvedilol (COREG) 3.125 MG tablet Take 1 tablet (3.125 mg total) by mouth 2 (two) times daily with a meal. 11/10/17   Darlin Drop, DO  cyanocobalamin 1000 MCG tablet Take 1,000 mcg by mouth at bedtime.      [provider]  enoxaparin (LOVENOX) 150 MG/ML injection Inject 135 mg into the skin every 12 (twelve) hours. Per the patient and spouse    [provider]  hydrOXYzine (ATARAX/VISTARIL) 10 MG tablet Take 20 mg by mouth 2 (two) times daily as needed for itching.    [provider]  losartan (COZAAR) 50 MG tablet Take 1 tablet (50 mg total) by mouth daily. 11/11/17   Darlin Drop, DO  pantoprazole (PROTONIX) 40 MG tablet Take 1 tablet (40 mg total) by mouth 2 (two) times daily before a meal. 11/08/17   Rai, Ripudeep K, MD  sertraline (ZOLOFT) 100 MG tablet Take 100 mg by mouth at bedtime.     [provider]    Family History No family history on file.  Social History Social History   Tobacco Use   Smoking status: Former    Packs/day: 0.50    Types: Cigarettes   Smokeless tobacco: Never  Substance Use Topics   Alcohol use: No     Allergies   Hydrocodone, Latex, and Lisinopril   Review of Systems Review of Systems   Physical Exam Triage Vital Signs ED Triage Vitals [03/25/22 1604]  Enc Vitals Group     BP 116/76  Pulse Rate (!) 106     Resp 18     Temp 98.2 F (36.8 C)     Temp Source Oral     SpO2 93 %     Weight      Height      Head Circumference      Peak Flow      Pain Score 0     Pain Loc      Pain Edu?      Excl. in GC?    No data found.  Updated Vital Signs BP 116/76 (BP Location: Left Arm)   Pulse (!) 106   Temp 98.2 F (36.8 C) (Oral)   Resp 18   SpO2 93%   Visual Acuity Right Eye Distance:   Left Eye Distance:   Bilateral Distance:    Right Eye Near:   Left Eye Near:    Bilateral Near:     Physical Exam Vitals reviewed.  Constitutional:      General: He is not in acute distress.    Appearance: He is not toxic-appearing.  HENT:     Nose: Nose normal.     Mouth/Throat:     Mouth: Mucous membranes are moist.     Pharynx: No oropharyngeal exudate or posterior oropharyngeal erythema.  Eyes:      Extraocular Movements: Extraocular movements intact.     Conjunctiva/sclera: Conjunctivae normal.     Pupils: Pupils are equal, round, and reactive to light.  Cardiovascular:     Rate and Rhythm: Normal rate and regular rhythm.     Heart sounds: No murmur heard. Pulmonary:     Effort: Pulmonary effort is normal.     Breath sounds: No stridor. Wheezing (low pitched wheezes bilaterally with good air movement) present. No rhonchi.  Musculoskeletal:     Cervical back: Neck supple.  Lymphadenopathy:     Cervical: No cervical adenopathy.  Skin:    Capillary Refill: Capillary refill takes less than 2 seconds.     Coloration: Skin is not jaundiced or pale.  Neurological:     General: No focal deficit present.     Mental Status: He is alert and oriented to person, place, and time.  Psychiatric:        Behavior: Behavior normal.      UC Treatments / Results  Labs (all labs ordered are listed, but only abnormal results are displayed) Labs Reviewed  NOVEL CORONAVIRUS, NAA    EKG   Radiology No results found.  Procedures Procedures (including critical care time)  Medications Ordered in UC Medications - No data to display  Initial Impression / Assessment and Plan / UC Course  I have reviewed the triage vital signs and the nursing notes.  Pertinent labs & imaging results that were available during my care of the patient were reviewed by me and considered in my medical decision making (see chart for details).     COVID swab done today.  If we can get his his results back within the 5-day mark, which would be tomorrow, then he should have a prescription for Paxlovid.  He is at risk for severe COVID disease with his history of heart disease and also his renal function was normal when last done  I was going to send him in an albuterol prescription, but he says he does not need it.  He states the stem cell patch he is wearing is keeping him from needing anything for the  wheezing Final Clinical Impressions(s) / UC  Diagnoses   Final diagnoses:  Viral URI with cough   Discharge Instructions   None    ED Prescriptions   None    PDMP not reviewed this encounter.   Zenia Resides, MD 03/25/22 1640

## 2022-03-25 NOTE — Discharge Instructions (Signed)
°  You have been swabbed for COVID, and the test will result in the next 24 hours. Our staff will call you if positive. If the test is positive, you should quarantine for 5 days.  °

## 2022-03-26 ENCOUNTER — Telehealth (HOSPITAL_COMMUNITY): Payer: Self-pay | Admitting: Emergency Medicine

## 2022-03-26 LAB — NOVEL CORONAVIRUS, NAA: SARS-CoV-2, NAA: DETECTED — AB

## 2022-03-26 MED ORDER — NIRMATRELVIR/RITONAVIR (PAXLOVID)TABLET: 3.0000 | ORAL_TABLET | Freq: Two times a day (BID) | ORAL | 0 refills | Status: AC

## 2022-03-26 MED ORDER — NIRMATRELVIR/RITONAVIR (PAXLOVID)TABLET: 3.0000 | ORAL_TABLET | Freq: Two times a day (BID) | ORAL | 0 refills | Status: DC

## 2024-04-06 ENCOUNTER — Encounter: Payer: Self-pay | Admitting: Advanced Practice Midwife

## 2024-08-07 ENCOUNTER — Other Ambulatory Visit (HOSPITAL_COMMUNITY): Payer: Self-pay | Admitting: Student in an Organized Health Care Education/Training Program

## 2024-08-07 DIAGNOSIS — I739 Peripheral vascular disease, unspecified: Secondary | ICD-10-CM

## 2024-10-18 ENCOUNTER — Other Ambulatory Visit: Payer: Self-pay | Admitting: Nurse Practitioner

## 2024-10-18 DIAGNOSIS — M5416 Radiculopathy, lumbar region: Secondary | ICD-10-CM

## 2024-10-25 ENCOUNTER — Other Ambulatory Visit

## 2024-11-12 ENCOUNTER — Other Ambulatory Visit
